# Patient Record
Sex: Female | Born: 1975 | Race: Black or African American | Hispanic: No | Marital: Single | Smoking: Never smoker
Health system: Southern US, Community
[De-identification: ages and names within clinical notes are randomized; demographics above are authoritative.]

## PROBLEM LIST (undated history)

## (undated) DIAGNOSIS — N920 Excessive and frequent menstruation with regular cycle: Secondary | ICD-10-CM

## (undated) DIAGNOSIS — N760 Acute vaginitis: Secondary | ICD-10-CM

## (undated) DIAGNOSIS — N898 Other specified noninflammatory disorders of vagina: Secondary | ICD-10-CM

## (undated) DIAGNOSIS — B9689 Other specified bacterial agents as the cause of diseases classified elsewhere: Secondary | ICD-10-CM

## (undated) DIAGNOSIS — E669 Obesity, unspecified: Secondary | ICD-10-CM

## (undated) DIAGNOSIS — Z8619 Personal history of other infectious and parasitic diseases: Secondary | ICD-10-CM

## (undated) DIAGNOSIS — D219 Benign neoplasm of connective and other soft tissue, unspecified: Secondary | ICD-10-CM

## (undated) DIAGNOSIS — D649 Anemia, unspecified: Secondary | ICD-10-CM

## (undated) DIAGNOSIS — L709 Acne, unspecified: Secondary | ICD-10-CM

## (undated) HISTORY — DX: Personal history of other infectious and parasitic diseases: Z86.19

## (undated) HISTORY — PX: NO PAST SURGERIES: SHX2092

## (undated) HISTORY — DX: Other specified noninflammatory disorders of vagina: N89.8

## (undated) HISTORY — DX: Benign neoplasm of connective and other soft tissue, unspecified: D21.9

## (undated) HISTORY — DX: Anemia, unspecified: D64.9

## (undated) HISTORY — DX: Excessive and frequent menstruation with regular cycle: N92.0

## (undated) HISTORY — DX: Obesity, unspecified: E66.9

## (undated) HISTORY — DX: Acute vaginitis: N76.0

## (undated) HISTORY — DX: Other specified bacterial agents as the cause of diseases classified elsewhere: B96.89

## (undated) HISTORY — DX: Acne, unspecified: L70.9

---

## 2001-08-02 ENCOUNTER — Other Ambulatory Visit: Admission: RE | Admit: 2001-08-02 | Discharge: 2001-08-02 | Payer: Self-pay | Admitting: Obstetrics and Gynecology

## 2002-07-25 ENCOUNTER — Other Ambulatory Visit: Admission: RE | Admit: 2002-07-25 | Discharge: 2002-07-25 | Payer: Self-pay | Admitting: Dermatology

## 2003-02-17 ENCOUNTER — Other Ambulatory Visit: Admission: RE | Admit: 2003-02-17 | Discharge: 2003-02-17 | Payer: Self-pay | Admitting: Dermatology

## 2007-12-28 ENCOUNTER — Other Ambulatory Visit: Admission: RE | Admit: 2007-12-28 | Discharge: 2007-12-28 | Payer: Self-pay | Admitting: Obstetrics & Gynecology

## 2008-11-05 ENCOUNTER — Ambulatory Visit (HOSPITAL_COMMUNITY): Admission: RE | Admit: 2008-11-05 | Discharge: 2008-11-05 | Payer: Self-pay | Admitting: Obstetrics and Gynecology

## 2010-07-22 ENCOUNTER — Other Ambulatory Visit (HOSPITAL_COMMUNITY)
Admission: RE | Admit: 2010-07-22 | Discharge: 2010-07-22 | Disposition: A | Discharge status: Home / Self Care | Payer: PRIVATE HEALTH INSURANCE | Source: Ambulatory Visit | Attending: Obstetrics and Gynecology | Admitting: Obstetrics and Gynecology

## 2010-07-22 DIAGNOSIS — Z01419 Encounter for gynecological examination (general) (routine) without abnormal findings: Secondary | ICD-10-CM | POA: Insufficient documentation

## 2010-07-22 DIAGNOSIS — Z113 Encounter for screening for infections with a predominantly sexual mode of transmission: Secondary | ICD-10-CM | POA: Insufficient documentation

## 2011-03-02 ENCOUNTER — Ambulatory Visit: Payer: PRIVATE HEALTH INSURANCE | Admitting: Family Medicine

## 2011-03-02 ENCOUNTER — Encounter: Payer: Self-pay | Admitting: Family Medicine

## 2011-07-04 DIAGNOSIS — Z1389 Encounter for screening for other disorder: Secondary | ICD-10-CM | POA: Diagnosis not present

## 2011-07-04 DIAGNOSIS — L708 Other acne: Secondary | ICD-10-CM | POA: Diagnosis not present

## 2011-08-02 ENCOUNTER — Other Ambulatory Visit (HOSPITAL_COMMUNITY)
Admission: RE | Admit: 2011-08-02 | Discharge: 2011-08-02 | Disposition: A | Discharge status: Home / Self Care | Payer: Medicare Other | Source: Ambulatory Visit | Attending: Obstetrics & Gynecology | Admitting: Obstetrics & Gynecology

## 2011-08-02 DIAGNOSIS — Z124 Encounter for screening for malignant neoplasm of cervix: Secondary | ICD-10-CM | POA: Diagnosis not present

## 2011-08-02 DIAGNOSIS — Z1389 Encounter for screening for other disorder: Secondary | ICD-10-CM | POA: Diagnosis not present

## 2012-03-15 DIAGNOSIS — N760 Acute vaginitis: Secondary | ICD-10-CM | POA: Diagnosis not present

## 2012-03-15 DIAGNOSIS — Z3202 Encounter for pregnancy test, result negative: Secondary | ICD-10-CM | POA: Diagnosis not present

## 2012-03-15 DIAGNOSIS — D259 Leiomyoma of uterus, unspecified: Secondary | ICD-10-CM | POA: Diagnosis not present

## 2012-03-15 DIAGNOSIS — L708 Other acne: Secondary | ICD-10-CM | POA: Diagnosis not present

## 2012-05-03 DIAGNOSIS — N760 Acute vaginitis: Secondary | ICD-10-CM | POA: Diagnosis not present

## 2012-07-20 DIAGNOSIS — H6120 Impacted cerumen, unspecified ear: Secondary | ICD-10-CM | POA: Diagnosis not present

## 2012-09-04 ENCOUNTER — Other Ambulatory Visit: Payer: Self-pay | Admitting: Obstetrics & Gynecology

## 2012-09-04 MED ORDER — NORGESTIM-ETH ESTRAD TRIPHASIC 0.18/0.215/0.25 MG-25 MCG PO TABS: 1.0000 | ORAL_TABLET | Freq: Every day | ORAL | Status: DC

## 2012-09-10 ENCOUNTER — Other Ambulatory Visit: Payer: Self-pay | Admitting: Adult Health

## 2012-10-24 ENCOUNTER — Encounter: Payer: Self-pay | Admitting: *Deleted

## 2012-10-25 ENCOUNTER — Ambulatory Visit: Payer: Self-pay | Admitting: Adult Health

## 2012-11-28 ENCOUNTER — Other Ambulatory Visit: Payer: Self-pay | Admitting: Obstetrics & Gynecology

## 2012-11-29 ENCOUNTER — Other Ambulatory Visit: Payer: Self-pay | Admitting: Obstetrics & Gynecology

## 2012-12-12 DIAGNOSIS — H6120 Impacted cerumen, unspecified ear: Secondary | ICD-10-CM | POA: Diagnosis not present

## 2012-12-12 DIAGNOSIS — H01119 Allergic dermatitis of unspecified eye, unspecified eyelid: Secondary | ICD-10-CM | POA: Diagnosis not present

## 2013-02-06 DIAGNOSIS — H53149 Visual discomfort, unspecified: Secondary | ICD-10-CM | POA: Diagnosis not present

## 2013-02-13 DIAGNOSIS — H53149 Visual discomfort, unspecified: Secondary | ICD-10-CM | POA: Diagnosis not present

## 2013-02-13 DIAGNOSIS — H01119 Allergic dermatitis of unspecified eye, unspecified eyelid: Secondary | ICD-10-CM | POA: Diagnosis not present

## 2013-03-06 ENCOUNTER — Other Ambulatory Visit: Payer: Self-pay | Admitting: Adult Health

## 2013-05-31 ENCOUNTER — Other Ambulatory Visit: Payer: Self-pay | Admitting: Adult Health

## 2013-06-24 DIAGNOSIS — J069 Acute upper respiratory infection, unspecified: Secondary | ICD-10-CM | POA: Diagnosis not present

## 2013-06-24 DIAGNOSIS — J392 Other diseases of pharynx: Secondary | ICD-10-CM | POA: Diagnosis not present

## 2013-08-09 ENCOUNTER — Other Ambulatory Visit: Payer: Self-pay | Admitting: Adult Health

## 2013-10-01 ENCOUNTER — Other Ambulatory Visit: Payer: Self-pay | Admitting: Adult Health

## 2013-10-02 ENCOUNTER — Other Ambulatory Visit: Payer: Self-pay | Admitting: Adult Health

## 2013-10-09 ENCOUNTER — Other Ambulatory Visit: Payer: Self-pay | Admitting: Adult Health

## 2013-10-25 ENCOUNTER — Other Ambulatory Visit: Payer: Self-pay | Admitting: Adult Health

## 2013-10-25 ENCOUNTER — Encounter: Payer: Self-pay | Admitting: *Deleted

## 2013-10-26 ENCOUNTER — Other Ambulatory Visit: Payer: Self-pay | Admitting: Obstetrics & Gynecology

## 2013-11-06 DIAGNOSIS — J392 Other diseases of pharynx: Secondary | ICD-10-CM | POA: Diagnosis not present

## 2013-11-06 DIAGNOSIS — H6120 Impacted cerumen, unspecified ear: Secondary | ICD-10-CM | POA: Diagnosis not present

## 2013-11-08 ENCOUNTER — Ambulatory Visit (INDEPENDENT_AMBULATORY_CARE_PROVIDER_SITE_OTHER): Payer: Medicare Other | Admitting: Adult Health

## 2013-11-08 ENCOUNTER — Other Ambulatory Visit (HOSPITAL_COMMUNITY)
Admission: RE | Admit: 2013-11-08 | Discharge: 2013-11-08 | Disposition: A | Discharge status: Home / Self Care | Payer: Medicare Other | Source: Ambulatory Visit | Attending: Adult Health | Admitting: Adult Health

## 2013-11-08 ENCOUNTER — Encounter: Payer: Self-pay | Admitting: Adult Health

## 2013-11-08 VITALS — BP 124/78 | HR 76 | Ht 64.0 in | Wt 220.0 lb

## 2013-11-08 DIAGNOSIS — Z0000 Encounter for general adult medical examination without abnormal findings: Secondary | ICD-10-CM

## 2013-11-08 DIAGNOSIS — Z113 Encounter for screening for infections with a predominantly sexual mode of transmission: Secondary | ICD-10-CM | POA: Insufficient documentation

## 2013-11-08 DIAGNOSIS — N920 Excessive and frequent menstruation with regular cycle: Secondary | ICD-10-CM

## 2013-11-08 DIAGNOSIS — Z1151 Encounter for screening for human papillomavirus (HPV): Secondary | ICD-10-CM | POA: Diagnosis not present

## 2013-11-08 DIAGNOSIS — D219 Benign neoplasm of connective and other soft tissue, unspecified: Secondary | ICD-10-CM | POA: Insufficient documentation

## 2013-11-08 DIAGNOSIS — Z124 Encounter for screening for malignant neoplasm of cervix: Secondary | ICD-10-CM | POA: Diagnosis not present

## 2013-11-08 DIAGNOSIS — N760 Acute vaginitis: Secondary | ICD-10-CM

## 2013-11-08 DIAGNOSIS — Z01419 Encounter for gynecological examination (general) (routine) without abnormal findings: Secondary | ICD-10-CM

## 2013-11-08 DIAGNOSIS — N898 Other specified noninflammatory disorders of vagina: Secondary | ICD-10-CM

## 2013-11-08 DIAGNOSIS — L709 Acne, unspecified: Secondary | ICD-10-CM | POA: Insufficient documentation

## 2013-11-08 DIAGNOSIS — Z309 Encounter for contraceptive management, unspecified: Secondary | ICD-10-CM | POA: Insufficient documentation

## 2013-11-08 DIAGNOSIS — B9689 Other specified bacterial agents as the cause of diseases classified elsewhere: Secondary | ICD-10-CM | POA: Insufficient documentation

## 2013-11-08 HISTORY — DX: Excessive and frequent menstruation with regular cycle: N92.0

## 2013-11-08 HISTORY — DX: Other specified noninflammatory disorders of vagina: N89.8

## 2013-11-08 LAB — POCT WET PREP (WET MOUNT)
Trichomonas Wet Prep HPF POC: NEGATIVE
WBC, Wet Prep HPF POC: POSITIVE

## 2013-11-08 MED ORDER — METRONIDAZOLE 500 MG PO TABS: ORAL_TABLET | ORAL | Status: DC

## 2013-11-08 MED ORDER — DAPSONE 5 % EX GEL: CUTANEOUS | Status: DC

## 2013-11-08 MED ORDER — NORGESTIMATE-ETH ESTRADIOL 0.25-35 MG-MCG PO TABS: 1.0000 | ORAL_TABLET | Freq: Every day | ORAL | Status: DC

## 2013-11-08 NOTE — Progress Notes (Signed)
Patient ID: Kimberly Black, female   DOB: 1975/10/31, 38 y.o.   MRN: [id number] History of Present Illness:  Kimberly Black is a 38 year old black female in for a pap and physical.She is complaining of acne and heavy periods and clots, as fibroids, ran out of OCs and using condoms.Has vaginal odor.  Current Medications, Allergies, Past Medical History, Past Surgical History, Family History and Social History were reviewed in [employer] record.     Review of Systems: Patient denies any headaches, blurred vision, shortness of breath,  abdominal pain, problems with bowel movements, urination, or intercourse. No joint pain or mood swings, she did say she sometimes has chest pain,discussed if weak,dizzy,or has nausea go to ER to checked out.    Physical Exam:BP 124/78  Pulse 76  Ht 5\' 4"  (1.626 m)  Wt 220 lb (99.791 kg)  BMI 37.74 kg/m2  LMP 10/22/2013 General:  Well developed, well nourished, no acute distress Skin:  Warm and dry Neck:  Midline trachea, normal thyroid Lungs; Clear to auscultation bilaterally Breast:  No dominant palpable mass, retraction, or nipple discharge Cardiovascular: Regular rate and rhythm Abdomen:  Soft, non tender, no hepatosplenomegaly Pelvic:  External genitalia is normal in appearance.  The vagina has good color, moisture and rugae, scant white discharge with odor. The cervix is bulbous, pap performed with HPV and GC/CHL.  Uterus is felt to be enlarged about 20 weeks size..  No   adnexal masses or tenderness noted.Wet prep:+WBCs and + clue cells Extremities:  No swelling or varicosities noted Psych:  No mood changes, alert and cooperative,seems happy Discussed changing OCs to see if helps periods and will get [place].  Impression: Yearly gyn exam Fibroids Menorrhagia Acne BV Vaginal odor    Plan: Return in 2 weeks for [place] and see me and fasting labs(CBC,CMP,TSH and lipids) Physical in 1 year Mammogram at 39 Rx flagyl 500 mg 1 bid x 7  days, with 1 refill, no alcohol, review handout on BV   Refilled actzone Rx sprintec disp 1 pack take 1 daily and start back when period starts with 38 refills Review handout on fibroids

## 2013-11-08 NOTE — Patient Instructions (Signed)
Acne Acne is a skin problem that causes pimples. Acne occurs when the pores in your skin get blocked. Your pores may become red, sore, and swollen (inflamed), or infected with a common skin bacterium (Propionibacterium acnes). Acne is a common skin problem. Up to 80% of people get acne at some time. Acne is especially common from the ages of 76 to 25. Acne usually goes away over time with proper treatment. CAUSES  Your pores each contain an oil gland. The oil glands make an oily substance called sebum. Acne happens when these glands get plugged with sebum, dead skin cells, and dirt. The P. acnes bacteria that are normally found in the oil glands then multiply, causing inflammation. Acne is commonly triggered by changes in your hormones. These hormonal changes can cause the oil glands to get bigger and to make more sebum. Factors that can make acne worse include:  Hormone changes during adolescence.  Hormone changes during women's menstrual cycles.  Hormone changes during pregnancy.  Oil-based cosmetics and hair products.  Harshly scrubbing the skin.  Strong soaps.  Stress.  Hormone problems due to certain diseases.  Long or oily hair rubbing against the skin.  Certain medicines.  Pressure from headbands, backpacks, or shoulder pads.  Exposure to certain oils and chemicals. SYMPTOMS  Acne often occurs on the face, neck, chest, and upper back. Symptoms include:  Small, red bumps (pimples or papules).  Whiteheads (closed comedones).  Blackheads (open comedones).  Small, pus-filled pimples (pustules).  Big, red pimples or pustules that feel tender. More severe acne can cause:  An infected area that contains a collection of pus (abscess).  Hard, painful, fluid-filled sacs (cysts).  Scars. DIAGNOSIS  Your caregiver can usually tell what the problem is by doing a physical exam. TREATMENT  There are many good treatments for acne. Some are available over-the-counter and some  are available with a prescription. The treatment that is best for you depends on the type of acne you have and how severe it is. It may take 2 months of treatment before your acne gets better. Common treatments include:  Creams and lotions that prevent oil glands from clogging.  Creams and lotions that treat or prevent infections and inflammation.  Antibiotics applied to the skin or taken as a pill.  Pills that decrease sebum production.  Birth control pills.  Light or laser treatments.  Minor surgery.  Injections of medicine into the affected areas.  Chemicals that cause peeling of the skin. HOME CARE INSTRUCTIONS  Good skin care is the most important part of treatment.  Wash your skin gently at least twice a day and after exercise. Always wash your skin before bed.  Use mild soap.  After each wash, apply a water-based skin moisturizer.  Keep your hair clean and off of your face. Shampoo your hair daily.  Only take medicines as directed by your caregiver.  Use a sunscreen or sunblock with SPF 30 or greater. This is especially important when you are using acne medicines.  Choose cosmetics that are noncomedogenic. This means they do not plug the oil glands.  Avoid leaning your chin or forehead on your hands.  Avoid wearing tight headbands or hats.  Avoid picking or squeezing your pimples. This can make your acne worse and cause scarring. SEEK MEDICAL CARE IF:   Your acne is not better after 8 weeks.  Your acne gets worse.  You have a large area of skin that is red or tender. Document Released: 05/13/2000 Document  Revised: 08/08/2011 Document Reviewed: 03/04/2011 [employer] Patient Information 2014 [place], [place]. Uterine Fibroid A uterine fibroid is a growth (tumor) that occurs in your uterus. This type of tumor is not cancerous and does not spread out of the uterus. You can have one or many fibroids. Fibroids can vary in size, weight, and where they grow in the  uterus. Some can become quite large. Most fibroids do not require medical treatment, but some can cause pain or heavy bleeding during and between periods. CAUSES  A fibroid is the result of a single uterine cell that keeps growing (unregulated), which is different than most cells in the human body. Most cells have a control mechanism that keeps them from reproducing without control.  SIGNS AND SYMPTOMS   Bleeding.  Pelvic pain and pressure.  Bladder problems due to the size of the fibroid.  Infertility and miscarriages depending on the size and location of the fibroid. DIAGNOSIS  Uterine fibroids are diagnosed through a physical exam. Your health care provider may feel the lumpy tumors during a pelvic exam. Ultrasonography may be done to get information regarding size, location, and number of tumors.  TREATMENT   Your health care provider may recommend watchful waiting. This involves getting the fibroid checked by your health care provider to see if it grows or shrinks.   Hormone treatment or an intrauterine device (IUD) may be prescribed.   Surgery may be needed to remove the fibroids (myomectomy) or the uterus (hysterectomy). This depends on your situation. When fibroids interfere with fertility and a woman wants to become pregnant, a health care provider may recommend having the fibroids removed.  [place] care depends on how you were treated. In general:   Keep all follow-up appointments with your health care provider.   Only take over-the-counter or prescription medicines as directed by your health care provider. If you were prescribed a hormone treatment, take the hormone medicines exactly as directed. Do not take aspirin. It can cause bleeding.   Talk to your health care provider about taking iron pills.  If your periods are troublesome but not so heavy, lie down with your feet raised slightly above your heart. Place cold packs on your lower abdomen.    If your periods are heavy, write down the number of pads or tampons you use per month. Bring this information to your health care provider.   Include green vegetables in your diet.  SEEK IMMEDIATE MEDICAL CARE IF:  You have pelvic pain or cramps not controlled with medicines.   You have a sudden increase in pelvic pain.   You have an increase in bleeding between and during periods.   You have excessive periods and soak tampons or pads in a half hour or less.  You feel lightheaded or have fainting episodes. Document Released: 05/13/2000 Document Revised: 03/06/2013 Document Reviewed: 12/13/2012 [facility] Patient Information 2014 [place], [place]. Return in 2 weeks for [place] and labs Physical in 1 year Mammogram at 39

## 2013-11-11 LAB — CYTOLOGY - PAP

## 2013-11-15 DIAGNOSIS — H6120 Impacted cerumen, unspecified ear: Secondary | ICD-10-CM | POA: Diagnosis not present

## 2013-11-15 DIAGNOSIS — J392 Other diseases of pharynx: Secondary | ICD-10-CM | POA: Diagnosis not present

## 2013-11-22 ENCOUNTER — Encounter: Payer: Self-pay | Admitting: Adult Health

## 2013-11-22 ENCOUNTER — Ambulatory Visit (INDEPENDENT_AMBULATORY_CARE_PROVIDER_SITE_OTHER): Payer: Medicare Other | Admitting: Adult Health

## 2013-11-22 ENCOUNTER — Other Ambulatory Visit: Payer: Medicare Other

## 2013-11-22 ENCOUNTER — Ambulatory Visit (INDEPENDENT_AMBULATORY_CARE_PROVIDER_SITE_OTHER): Payer: Medicare Other

## 2013-11-22 VITALS — BP 112/82 | Ht 64.0 in | Wt 216.0 lb

## 2013-11-22 DIAGNOSIS — N920 Excessive and frequent menstruation with regular cycle: Secondary | ICD-10-CM

## 2013-11-22 DIAGNOSIS — D259 Leiomyoma of uterus, unspecified: Secondary | ICD-10-CM

## 2013-11-22 DIAGNOSIS — D219 Benign neoplasm of connective and other soft tissue, unspecified: Secondary | ICD-10-CM

## 2013-11-22 DIAGNOSIS — E785 Hyperlipidemia, unspecified: Secondary | ICD-10-CM | POA: Diagnosis not present

## 2013-11-22 DIAGNOSIS — N921 Excessive and frequent menstruation with irregular cycle: Secondary | ICD-10-CM

## 2013-11-22 LAB — CBC
HCT: 33.9 % — ABNORMAL LOW (ref 36.0–46.0)
Hemoglobin: 10.9 g/dL — ABNORMAL LOW (ref 12.0–15.0)
MCH: 22.7 pg — ABNORMAL LOW (ref 26.0–34.0)
MCHC: 32.2 g/dL (ref 30.0–36.0)
MCV: 70.6 fL — ABNORMAL LOW (ref 78.0–100.0)
Platelets: 384 K/uL (ref 150–400)
RBC: 4.8 MIL/uL (ref 3.87–5.11)
RDW: 17.1 % — ABNORMAL HIGH (ref 11.5–15.5)
WBC: 8.2 K/uL (ref 4.0–10.5)

## 2013-11-22 LAB — LIPID PANEL
CHOL/HDL RATIO: 3.6 ratio
Cholesterol: 185 mg/dL (ref 0–200)
HDL: 52 mg/dL (ref >39)
LDL CALC: 119 mg/dL — AB (ref 0–99)
TRIGLYCERIDES: 72 mg/dL (ref <150)
VLDL: 14 mg/dL (ref 0–40)

## 2013-11-22 LAB — COMPREHENSIVE METABOLIC PANEL
ALK PHOS: 57 U/L (ref 39–117)
ALT: 17 U/L (ref 0–35)
AST: 14 U/L (ref 0–37)
Albumin: 4.2 g/dL (ref 3.5–5.2)
BILIRUBIN TOTAL: 0.5 mg/dL (ref 0.2–1.2)
BUN: 10 mg/dL (ref 6–23)
CO2: 26 mEq/L (ref 19–32)
CREATININE: 0.59 mg/dL (ref 0.50–1.10)
Calcium: 9.2 mg/dL (ref 8.4–10.5)
Chloride: 103 mEq/L (ref 96–112)
Glucose, Bld: 87 mg/dL (ref 70–99)
Potassium: 4.4 mEq/L (ref 3.5–5.3)
Sodium: 138 mEq/L (ref 135–145)
Total Protein: 7.1 g/dL (ref 6.0–8.3)

## 2013-11-22 NOTE — Progress Notes (Signed)
Subjective:     Patient ID: Kimberly Black, female   DOB: April 01, 1976, 38 y.o.   MRN: [id number]  HPI [name] is a 38 year old black female in for [place] to assess for fibroids, has menorrhagia. Has some back pain today after [place]. Took meds for BV.To get fasting labs today.  Review of Systems See HPI Reviewed past medical,surgical, social and family history. Reviewed medications and allergies.     Objective:   Physical Exam BP 112/82  Ht 5\' 4"  (1.626 m)  Wt 216 lb (97.977 kg)  BMI 37.06 kg/m2  LMP 10/22/2013   Reviewed [place] with pt. Uterus 8.2 x 8.6 x 6.1 cm, with multiple fibroids noted with 1 exophytic fibroids noted off Fundus (extends to near epigastric area = 13.5 x 11.8 x 10.4cm) other fibroids within myometrium largest=4.5cm  Endometrium 10.7 mm, distorted by fibroids  Right ovary 2.8 x 1.6 x 1.5 cm,  Left ovary 5.2 x 3.3 x 3.1 cm, with 3.0 x 2.2cm simple cyst  No free fluid noted  Technician Comments:  Anteverted uterus with multiple fibroids, large exophytic fibroid noted, Endom-10.62mm distorted by fibroids, Rt ovary appears WNL, Lt ovary with 3.0 x 2.2cm simple cyst, no free fluid noted  Discussed with Dr Elonda Husky, will make appt to discuss surgical options.  Assessment:    Uterine fibroids Menorrhagia     Plan:     Return in 2 weeks to talk with Dr Elonda Husky about surgical options Review handout on fibroids and menorrhagia Check CBC,CMP,TSH and lipids

## 2013-11-22 NOTE — Patient Instructions (Addendum)
Menorrhagia Menorrhagia is the medical term for when your menstrual periods are heavy or last longer than usual. With menorrhagia, every period you have may cause enough blood loss and cramping that you are unable to maintain your usual activities. CAUSES  In some cases, the cause of heavy periods is unknown, but a number of conditions may cause menorrhagia. Common causes include:  A problem with the hormone-producing thyroid gland (hypothyroid).  Noncancerous growths in the uterus (polyps or fibroids).  An imbalance of the estrogen and progesterone hormones.  One of your ovaries not releasing an egg during one or more months.  Side effects of having an intrauterine device (IUD).  Side effects of some medicines, such as anti-inflammatory medicines or blood thinners.  A bleeding disorder that stops your blood from clotting normally. SIGNS AND SYMPTOMS  During a normal period, bleeding lasts between 4 and 8 days. Signs that your periods are too heavy include:  You routinely have to change your pad or tampon every 1 or 2 hours because it is completely soaked.  You pass blood clots larger than 1 inch (2.5 cm) in size.  You have bleeding for more than 7 days.  You need to use pads and tampons at the same time because of heavy bleeding.  You need to wake up to change your pads or tampons during the night.  You have symptoms of anemia, such as tiredness, fatigue, or shortness of breath. DIAGNOSIS  Your health care provider will perform a physical exam and ask you questions about your symptoms and menstrual history. Other tests may be ordered based on what the health care provider finds during the exam. These tests can include:  Blood tests. Blood tests are used to check if you are pregnant or have hormonal changes, a bleeding or thyroid disorder, low iron levels (anemia), or other problems.  Endometrial biopsy. Your health care provider takes a sample of tissue from the inside of your  uterus to be examined under a microscope.  Pelvic ultrasound. This test uses sound waves to make a picture of your uterus, ovaries, and vagina. The pictures can show if you have fibroids or other growths.  Hysteroscopy. For this test, your health care provider will use a small telescope to look inside your uterus. Based on the results of your initial tests, your health care provider may recommend further testing. TREATMENT  Treatment may not be needed. If it is needed, your health care provider may recommend treatment with one or more medicines first. If these do not reduce bleeding enough, a surgical treatment might be an option. The best treatment for you will depend on:   Whether you need to prevent pregnancy.  Your desire to have children in the future.  The cause and severity of your bleeding.  Your opinion and personal preference.  Medicines for menorrhagia may include:  Birth control methods that use hormones. These include the pill, skin patch, vaginal ring, shots that you get every 3 months, hormonal IUD, and implant. These treatments reduce bleeding during your menstrual period.  Medicines that thicken blood and slow bleeding.  Medicines that reduce swelling, such as ibuprofen.  Medicines that contain a synthetic hormone called progestin.   Medicines that make the ovaries stop working for a short time.  You may need surgical treatment for menorrhagia if the medicines are unsuccessful. Treatment options include:  Dilation and curettage (D&C). In this procedure, your health care provider opens (dilates) your cervix and then scrapes or suctions tissue from   the lining of your uterus to reduce menstrual bleeding.  Operative hysteroscopy. This procedure uses a tiny tube with a light (hysteroscope) to view your uterine cavity and can help in the surgical removal of a polyp that may be causing heavy periods.  Endometrial ablation. Through various techniques, your health care  provider permanently destroys the entire lining of your uterus (endometrium). After endometrial ablation, most women have little or no menstrual flow. Endometrial ablation reduces your ability to become pregnant.  Endometrial resection. This surgical procedure uses an electrosurgical wire loop to remove the lining of the uterus. This procedure also reduces your ability to become pregnant.  Hysterectomy. Surgical removal of the uterus and cervix is a permanent procedure that stops menstrual periods. Pregnancy is not possible after a hysterectomy. This procedure requires anesthesia and hospitalization. HOME CARE INSTRUCTIONS   Only take over-the-counter or prescription medicines as directed by your health care provider. Take prescribed medicines exactly as directed. Do not change or switch medicines without consulting your health care provider.  Take any prescribed iron pills exactly as directed by your health care provider. Long-term heavy bleeding may result in low iron levels. Iron pills help replace the iron your body lost from heavy bleeding. Iron may cause constipation. If this becomes a problem, increase the bran, fruits, and roughage in your diet.  Do not take aspirin or medicines that contain aspirin 1 week before or during your menstrual period. Aspirin may make the bleeding worse.  If you need to change your sanitary pad or tampon more than once every 2 hours, stay in bed and rest as much as possible until the bleeding stops.  Eat well-balanced meals. Eat foods high in iron. Examples are leafy green vegetables, meat, liver, eggs, and whole grain breads and cereals. Do not try to lose weight until the abnormal bleeding has stopped and your blood iron level is back to normal. SEEK MEDICAL CARE IF:   You soak through a pad or tampon every 1 or 2 hours, and this happens every time you have a period.  You need to use pads and tampons at the same time because you are bleeding so much.  You  need to change your pad or tampon during the night.  You have a period that lasts for more than 8 days.  You pass clots bigger than 1 inch wide.  You have irregular periods that happen more or less often than once a month.  You feel dizzy or faint.  You feel very weak or tired.  You feel short of breath or feel your heart is beating too fast when you exercise.  You have nausea and vomiting or diarrhea while you are taking your medicine.  You have any problems that may be related to the medicine you are taking. SEEK IMMEDIATE MEDICAL CARE IF:   You soak through 4 or more pads or tampons in 2 hours.  You have any bleeding while you are pregnant. MAKE SURE YOU:   Understand these instructions.  Will watch your condition.  Will get help right away if you are not doing well or get worse. Document Released: 05/16/2005 Document Revised: 05/21/2013 Document Reviewed: 11/04/2012 [facility] Patient Information 2015 [place], [place]. This information is not intended to replace advice given to you by your health care provider. Make sure you discuss any questions you have with your health care provider. Uterine Fibroid A uterine fibroid is a growth (tumor) that occurs in your uterus. This type of tumor is not cancerous  and does not spread out of the uterus. You can have one or many fibroids. Fibroids can vary in size, weight, and where they grow in the uterus. Some can become quite large. Most fibroids do not require medical treatment, but some can cause pain or heavy bleeding during and between periods. CAUSES  A fibroid is the result of a single uterine cell that keeps growing (unregulated), which is different than most cells in the human body. Most cells have a control mechanism that keeps them from reproducing without control.  SIGNS AND SYMPTOMS   Bleeding.  Pelvic pain and pressure.  Bladder problems due to the size of the fibroid.  Infertility and miscarriages depending on the size  and location of the fibroid. DIAGNOSIS  Uterine fibroids are diagnosed through a physical exam. Your health care provider may feel the lumpy tumors during a pelvic exam. Ultrasonography may be done to get information regarding size, location, and number of tumors.  TREATMENT   Your health care provider may recommend watchful waiting. This involves getting the fibroid checked by your health care provider to see if it grows or shrinks.   Hormone treatment or an intrauterine device (IUD) may be prescribed.   Surgery may be needed to remove the fibroids (myomectomy) or the uterus (hysterectomy). This depends on your situation. When fibroids interfere with fertility and a woman wants to become pregnant, a health care provider may recommend having the fibroids removed.  [place] care depends on how you were treated. In general:   Keep all follow-up appointments with your health care provider.   Only take over-the-counter or prescription medicines as directed by your health care provider. If you were prescribed a hormone treatment, take the hormone medicines exactly as directed. Do not take aspirin. It can cause bleeding.   Talk to your health care provider about taking iron pills.  If your periods are troublesome but not so heavy, lie down with your feet raised slightly above your heart. Place cold packs on your lower abdomen.   If your periods are heavy, write down the number of pads or tampons you use per month. Bring this information to your health care provider.   Include green vegetables in your diet.  SEEK IMMEDIATE MEDICAL CARE IF:  You have pelvic pain or cramps not controlled with medicines.   You have a sudden increase in pelvic pain.   You have an increase in bleeding between and during periods.   You have excessive periods and soak tampons or pads in a half hour or less.  You feel lightheaded or have fainting episodes. Document Released:  05/13/2000 Document Revised: 03/06/2013 Document Reviewed: 12/13/2012 [facility] Patient Information 2015 [place], [place]. This information is not intended to replace advice given to you by your health care provider. Make sure you discuss any questions you have with your health care provider. Return in 2 weeks to talk with Dr Elonda Husky about options

## 2013-11-23 LAB — TSH: TSH: 0.829 u[IU]/mL (ref 0.350–4.500)

## 2013-11-25 ENCOUNTER — Telehealth: Payer: Self-pay | Admitting: Adult Health

## 2013-11-25 ENCOUNTER — Encounter: Payer: Self-pay | Admitting: Adult Health

## 2013-11-25 DIAGNOSIS — D649 Anemia, unspecified: Secondary | ICD-10-CM

## 2013-11-25 HISTORY — DX: Anemia, unspecified: D64.9

## 2013-11-25 NOTE — Telephone Encounter (Signed)
Pt aware of labs and need to take MV with Iron keep appt

## 2013-11-25 NOTE — Telephone Encounter (Signed)
**Note De-identified Kimberly Black Obfuscation** Left message with her Mom to call me 

## 2013-12-06 ENCOUNTER — Ambulatory Visit: Payer: Medicare Other | Admitting: Obstetrics & Gynecology

## 2013-12-10 ENCOUNTER — Ambulatory Visit: Payer: Medicare Other | Admitting: Obstetrics & Gynecology

## 2013-12-12 ENCOUNTER — Ambulatory Visit: Payer: Medicare Other | Admitting: Obstetrics & Gynecology

## 2013-12-12 ENCOUNTER — Encounter: Payer: Self-pay | Admitting: *Deleted

## 2014-03-20 ENCOUNTER — Other Ambulatory Visit: Payer: Self-pay | Admitting: Adult Health

## 2014-03-31 ENCOUNTER — Encounter: Payer: Self-pay | Admitting: Adult Health

## 2014-05-01 ENCOUNTER — Telehealth: Payer: Self-pay | Admitting: Adult Health

## 2014-05-01 NOTE — Telephone Encounter (Signed)
Spoke with pt letting her know we received prior auth form and fillled it out and faxed it over. We are waiting to see if they will cover med or not. [place]

## 2014-05-01 NOTE — Telephone Encounter (Signed)
Pt is requesting Aczone gel and was told it needed prior auth. Advised pt to call pharmacy and have them send over prior auth form. Pt voiced understanding. Kimberly Black

## 2014-05-02 ENCOUNTER — Telehealth: Payer: Self-pay | Admitting: *Deleted

## 2014-05-02 NOTE — Telephone Encounter (Signed)
Antonette with [facility] Medicare called stating Aczone gel was approved from between 05/01/14-05/02/15. Pt has been made aware. [place]

## 2014-08-06 ENCOUNTER — Other Ambulatory Visit: Payer: Self-pay | Admitting: Adult Health

## 2014-08-15 DIAGNOSIS — M1990 Unspecified osteoarthritis, unspecified site: Secondary | ICD-10-CM | POA: Diagnosis not present

## 2014-08-15 DIAGNOSIS — M79609 Pain in unspecified limb: Secondary | ICD-10-CM | POA: Diagnosis not present

## 2014-08-15 DIAGNOSIS — M791 Myalgia: Secondary | ICD-10-CM | POA: Diagnosis not present

## 2014-08-15 DIAGNOSIS — M25562 Pain in left knee: Secondary | ICD-10-CM | POA: Diagnosis not present

## 2014-09-30 ENCOUNTER — Emergency Department (HOSPITAL_COMMUNITY): Payer: Medicare Other

## 2014-09-30 ENCOUNTER — Encounter (HOSPITAL_COMMUNITY): Payer: Self-pay

## 2014-09-30 ENCOUNTER — Emergency Department (HOSPITAL_COMMUNITY)
Admission: EM | Admit: 2014-09-30 | Discharge: 2014-09-30 | Disposition: A | Discharge status: Home / Self Care | Payer: Medicare Other | Attending: Emergency Medicine | Admitting: Emergency Medicine

## 2014-09-30 DIAGNOSIS — S8002XA Contusion of left knee, initial encounter: Secondary | ICD-10-CM | POA: Insufficient documentation

## 2014-09-30 DIAGNOSIS — Z791 Long term (current) use of non-steroidal anti-inflammatories (NSAID): Secondary | ICD-10-CM | POA: Insufficient documentation

## 2014-09-30 DIAGNOSIS — M545 Low back pain, unspecified: Secondary | ICD-10-CM

## 2014-09-30 DIAGNOSIS — Y998 Other external cause status: Secondary | ICD-10-CM | POA: Insufficient documentation

## 2014-09-30 DIAGNOSIS — M79605 Pain in left leg: Secondary | ICD-10-CM | POA: Diagnosis not present

## 2014-09-30 DIAGNOSIS — M5416 Radiculopathy, lumbar region: Secondary | ICD-10-CM | POA: Diagnosis not present

## 2014-09-30 DIAGNOSIS — Y9389 Activity, other specified: Secondary | ICD-10-CM | POA: Diagnosis not present

## 2014-09-30 DIAGNOSIS — R52 Pain, unspecified: Secondary | ICD-10-CM | POA: Diagnosis not present

## 2014-09-30 DIAGNOSIS — S3992XA Unspecified injury of lower back, initial encounter: Secondary | ICD-10-CM | POA: Diagnosis not present

## 2014-09-30 DIAGNOSIS — Y92410 Unspecified street and highway as the place of occurrence of the external cause: Secondary | ICD-10-CM | POA: Insufficient documentation

## 2014-09-30 DIAGNOSIS — Z79899 Other long term (current) drug therapy: Secondary | ICD-10-CM | POA: Insufficient documentation

## 2014-09-30 DIAGNOSIS — S8992XA Unspecified injury of left lower leg, initial encounter: Secondary | ICD-10-CM | POA: Diagnosis present

## 2014-09-30 MED ORDER — OXYCODONE-ACETAMINOPHEN 5-325 MG PO TABS
1.0000 | ORAL_TABLET | Freq: Once | ORAL | Status: AC
  Administered 2014-09-30: 1 via ORAL
  Filled 2014-09-30: qty 1

## 2014-09-30 MED ORDER — CYCLOBENZAPRINE HCL 10 MG PO TABS: 10.0000 mg | ORAL_TABLET | Freq: Two times a day (BID) | ORAL | Status: DC | PRN

## 2014-09-30 MED ORDER — NAPROXEN 500 MG PO TABS: 500.0000 mg | ORAL_TABLET | Freq: Two times a day (BID) | ORAL | Status: DC

## 2014-09-30 MED ORDER — HYDROCODONE-ACETAMINOPHEN 5-325 MG PO TABS: 1.0000 | ORAL_TABLET | ORAL | Status: DC | PRN
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
## 2014-09-30 NOTE — ED Notes (Signed)
Pt verbalized understanding of no driving and to use caution within 4 hours of taking pain meds due to meds cause drowsiness 
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
## 2014-11-29 ENCOUNTER — Other Ambulatory Visit: Payer: Self-pay | Admitting: Adult Health

## 2014-12-04 ENCOUNTER — Other Ambulatory Visit: Payer: Self-pay | Admitting: Adult Health

## 2014-12-08 ENCOUNTER — Other Ambulatory Visit: Payer: Self-pay | Admitting: Adult Health

## 2014-12-12 ENCOUNTER — Other Ambulatory Visit: Payer: Self-pay | Admitting: Adult Health

## 2014-12-23 ENCOUNTER — Ambulatory Visit: Payer: Medicare Other | Admitting: Adult Health

## 2014-12-31 ENCOUNTER — Ambulatory Visit: Payer: Medicare Other | Admitting: Adult Health

## 2015-01-07 ENCOUNTER — Ambulatory Visit (INDEPENDENT_AMBULATORY_CARE_PROVIDER_SITE_OTHER): Payer: Medicare Other | Admitting: Adult Health

## 2015-01-07 ENCOUNTER — Encounter: Payer: Self-pay | Admitting: Adult Health

## 2015-01-07 VITALS — BP 128/70 | HR 80 | Ht 63.0 in | Wt 207.5 lb

## 2015-01-07 DIAGNOSIS — L709 Acne, unspecified: Secondary | ICD-10-CM | POA: Diagnosis not present

## 2015-01-07 DIAGNOSIS — A499 Bacterial infection, unspecified: Secondary | ICD-10-CM | POA: Diagnosis not present

## 2015-01-07 DIAGNOSIS — B9689 Other specified bacterial agents as the cause of diseases classified elsewhere: Secondary | ICD-10-CM

## 2015-01-07 DIAGNOSIS — N9489 Other specified conditions associated with female genital organs and menstrual cycle: Secondary | ICD-10-CM

## 2015-01-07 DIAGNOSIS — N760 Acute vaginitis: Secondary | ICD-10-CM

## 2015-01-07 DIAGNOSIS — N898 Other specified noninflammatory disorders of vagina: Secondary | ICD-10-CM | POA: Diagnosis not present

## 2015-01-07 DIAGNOSIS — D259 Leiomyoma of uterus, unspecified: Secondary | ICD-10-CM

## 2015-01-07 HISTORY — DX: Other specified noninflammatory disorders of vagina: N89.8

## 2015-01-07 MED ORDER — METRONIDAZOLE 500 MG PO TABS
500.0000 mg | ORAL_TABLET | Freq: Two times a day (BID) | ORAL | Status: DC
Start: 2015-01-07 — End: 2015-03-17

## 2015-01-07 MED ORDER — DOXYCYCLINE HYCLATE 100 MG PO CAPS: ORAL_CAPSULE | ORAL | Status: DC

## 2015-01-07 MED ORDER — DAPSONE 5 % EX GEL: CUTANEOUS | Status: DC

## 2015-01-07 NOTE — Progress Notes (Signed)
Subjective:     Patient ID: Kimberly Black, female   DOB: 27-Jul-1975, 39 y.o.   MRN: [id number]  HPI Kimberly Black is a 39 year old black female in complaining of vaginal discharge with odor, and she requests refills on acne meds.  Review of Systems Patient denies any headaches, hearing loss, fatigue, blurred vision, shortness of breath, chest pain, abdominal pain, problems with bowel movements, urination, or intercourse. No joint pain or mood swings, has clots with periods and back and legs ache at times. .See HPI for positives.  Reviewed past medical,surgical, social and family history. Reviewed medications and allergies.     Objective:   Physical Exam BP 128/70 mmHg  Pulse 80  Ht 5\' 3"  (1.6 m)  Wt 207 lb 8 oz (94.121 kg)  BMI 36.77 kg/m2  LMP 12/24/2014 Skin warm and dry.Pelvic: external genitalia is normal in appearance no lesions, vagina: white discharge with odor,urethra has no lesions or masses noted, cervix:smooth and bulbous, uterus: enlarged about 21-22 week size, with fibroid at fundus, no masses felt, adnexa: no masses or tenderness noted. Bladder is non tender and no masses felt. Wet prep: + for clue cells and +WBCs.   She has known fibroids and is on OCs, can't have surgery just yet is caring for mom.  Assessment:     Vaginal discharge Vaginal odor BV Fibroids Acne     Plan:    Continue OCs Rx flagyl 500 mg 1 bid x 7 days, no alcohol, review handout on BV   Refilled dapsone x 3  Refilled doxycycline x 3 Return in 4 weeks for physical

## 2015-01-07 NOTE — Patient Instructions (Signed)
Bacterial Vaginosis Bacterial vaginosis is a vaginal infection that occurs when the normal balance of bacteria in the vagina is disrupted. It results from an overgrowth of certain bacteria. This is the most common vaginal infection in women of childbearing age. Treatment is important to prevent complications, especially in pregnant women, as it can cause a premature delivery. CAUSES  Bacterial vaginosis is caused by an increase in harmful bacteria that are normally present in smaller amounts in the vagina. Several different kinds of bacteria can cause bacterial vaginosis. However, the reason that the condition develops is not fully understood. RISK FACTORS Certain activities or behaviors can put you at an increased risk of developing bacterial vaginosis, including:  Having a new sex partner or multiple sex partners.  Douching.  Using an intrauterine device (IUD) for contraception. Women do not get bacterial vaginosis from toilet seats, bedding, swimming pools, or contact with objects around them. SIGNS AND SYMPTOMS  Some women with bacterial vaginosis have no signs or symptoms. Common symptoms include:  Grey vaginal discharge.  A fishlike odor with discharge, especially after sexual intercourse.  Itching or burning of the vagina and vulva.  Burning or pain with urination. DIAGNOSIS  Your health care provider will take a medical history and examine the vagina for signs of bacterial vaginosis. A sample of vaginal fluid may be taken. Your health care provider will look at this sample under a microscope to check for bacteria and abnormal cells. A vaginal pH test may also be done.  TREATMENT  Bacterial vaginosis may be treated with antibiotic medicines. These may be given in the form of a pill or a vaginal cream. A second round of antibiotics may be prescribed if the condition comes back after treatment.  HOME CARE INSTRUCTIONS   Only take over-the-counter or prescription medicines as  directed by your health care provider.  If antibiotic medicine was prescribed, take it as directed. Make sure you finish it even if you start to feel better.  Do not have sex until treatment is completed.  Tell all sexual partners that you have a vaginal infection. They should see their health care provider and be treated if they have problems, such as a mild rash or itching.  Practice safe sex by using condoms and only having one sex partner. SEEK MEDICAL CARE IF:   Your symptoms are not improving after 3 days of treatment.  You have increased discharge or pain.  You have a fever. MAKE SURE YOU:   Understand these instructions.  Will watch your condition.  Will get help right away if you are not doing well or get worse. FOR MORE INFORMATION  Centers for Disease Control and Prevention, Division of STD Prevention: AppraiserFraud.fi American Sexual Health Association (ASHA): www.ashastd.org  Document Released: 05/16/2005 Document Revised: 03/06/2013 Document Reviewed: 12/26/2012 [facility] Patient Information 2015 [place], [place]. This information is not intended to replace advice given to you by your health care provider. Make sure you discuss any questions you have with your health care provider. Return in 4 weeks for physical

## 2015-02-05 ENCOUNTER — Other Ambulatory Visit: Payer: Medicare Other | Admitting: Adult Health

## 2015-02-16 ENCOUNTER — Encounter: Payer: Self-pay | Admitting: Adult Health

## 2015-02-16 ENCOUNTER — Other Ambulatory Visit: Payer: Medicare Other | Admitting: Adult Health

## 2015-03-17 ENCOUNTER — Other Ambulatory Visit: Payer: Self-pay | Admitting: Adult Health

## 2015-05-07 ENCOUNTER — Other Ambulatory Visit: Payer: Self-pay | Admitting: Adult Health

## 2015-05-08 ENCOUNTER — Other Ambulatory Visit: Payer: Self-pay | Admitting: Adult Health

## 2015-06-29 ENCOUNTER — Telehealth: Payer: Self-pay | Admitting: Advanced Practice Midwife

## 2015-06-29 NOTE — Telephone Encounter (Signed)
Pt requesting a refill on Retina 0.025%. Pt states Nigel Berthold, CNM filled in the past.   Pt informed Manus Gunning not in the office until Wednesday, verbalized understanding.

## 2015-07-01 MED ORDER — TRETINOIN 0.025 % EX CREA: TOPICAL_CREAM | Freq: Every day | CUTANEOUS | Status: DC

## 2015-07-01 NOTE — Telephone Encounter (Signed)
Retin a 0.25% 45g w/3 RF sent to p harmacy

## 2015-07-07 ENCOUNTER — Ambulatory Visit (INDEPENDENT_AMBULATORY_CARE_PROVIDER_SITE_OTHER): Payer: Medicare Other | Admitting: Adult Health

## 2015-07-07 ENCOUNTER — Encounter: Payer: Self-pay | Admitting: Adult Health

## 2015-07-07 VITALS — BP 122/70 | HR 72 | Ht 63.0 in | Wt 211.0 lb

## 2015-07-07 DIAGNOSIS — N898 Other specified noninflammatory disorders of vagina: Secondary | ICD-10-CM | POA: Diagnosis not present

## 2015-07-07 DIAGNOSIS — B9689 Other specified bacterial agents as the cause of diseases classified elsewhere: Secondary | ICD-10-CM

## 2015-07-07 DIAGNOSIS — N9489 Other specified conditions associated with female genital organs and menstrual cycle: Secondary | ICD-10-CM | POA: Diagnosis not present

## 2015-07-07 DIAGNOSIS — A499 Bacterial infection, unspecified: Secondary | ICD-10-CM

## 2015-07-07 DIAGNOSIS — N760 Acute vaginitis: Secondary | ICD-10-CM

## 2015-07-07 LAB — POCT WET PREP (WET MOUNT)
Clue Cells Wet Prep Whiff POC: POSITIVE
WBC, Wet Prep HPF POC: POSITIVE

## 2015-07-07 MED ORDER — METRONIDAZOLE 500 MG PO TABS: 500.0000 mg | ORAL_TABLET | Freq: Two times a day (BID) | ORAL | Status: DC

## 2015-07-07 NOTE — Progress Notes (Signed)
Subjective:     Patient ID: Kimberly Black, female   DOB: 03-Mar-1976, 40 y.o.   MRN: [id number]  HPI Kimberly Black is a 40 year old black female in complaining of vaginal discharge with odor and some itching and burning at times, no new products or partners.  Review of Systems Patient denies any headaches, hearing loss, fatigue, blurred vision, shortness of breath, chest pain, abdominal pain, problems with bowel movements, urination, or intercourse. No joint pain or mood swings.See HPI for positives. Reviewed past medical,surgical, social and family history. Reviewed medications and allergies.     Objective:   Physical Exam BP 122/70 mmHg  Pulse 72  Ht 5\' 3"  (1.6 m)  Wt 211 lb (95.709 kg)  BMI 37.39 kg/m2  LMP 06/14/2015 Skin warm and dry.Pelvic: external genitalia is normal in appearance no lesions, vagina: white discharge with odor,urethra has no lesions or masses noted, cervix:smooth and bulbous, uterus: normal size, shape and contour, non tender, no masses felt, adnexa: no masses or tenderness noted. Bladder is non tender and no masses felt. Abdomen is soft and non tender, no HSM noted, pap due next year. Wet prep: + for clue cells and +WBCs.Discussed not wearing thongs, taking tub baths and not douching.Face time 15 minutes, with 50 % counseling. She declines STD testing.     Assessment:     Vaginal discharge  Vaginal odor BV    Plan:     Rx flagyl 500 mg 1 bid x 7 days,with 1 refill, no alcohol, review handout on BV   Follow up prn

## 2015-07-07 NOTE — Patient Instructions (Signed)
Bacterial Vaginosis Bacterial vaginosis is a vaginal infection that occurs when the normal balance of bacteria in the vagina is disrupted. It results from an overgrowth of certain bacteria. This is the most common vaginal infection in women of childbearing age. Treatment is important to prevent complications, especially in pregnant women, as it can cause a premature delivery. CAUSES  Bacterial vaginosis is caused by an increase in harmful bacteria that are normally present in smaller amounts in the vagina. Several different kinds of bacteria can cause bacterial vaginosis. However, the reason that the condition develops is not fully understood. RISK FACTORS Certain activities or behaviors can put you at an increased risk of developing bacterial vaginosis, including:  Having a new sex partner or multiple sex partners.  Douching.  Using an intrauterine device (IUD) for contraception. Women do not get bacterial vaginosis from toilet seats, bedding, swimming pools, or contact with objects around them. SIGNS AND SYMPTOMS  Some women with bacterial vaginosis have no signs or symptoms. Common symptoms include:  Grey vaginal discharge.  A fishlike odor with discharge, especially after sexual intercourse.  Itching or burning of the vagina and vulva.  Burning or pain with urination. DIAGNOSIS  Your health care provider will take a medical history and examine the vagina for signs of bacterial vaginosis. A sample of vaginal fluid may be taken. Your health care provider will look at this sample under a microscope to check for bacteria and abnormal cells. A vaginal pH test may also be done.  TREATMENT  Bacterial vaginosis may be treated with antibiotic medicines. These may be given in the form of a pill or a vaginal cream. A second round of antibiotics may be prescribed if the condition comes back after treatment. Because bacterial vaginosis increases your risk for sexually transmitted diseases, getting  treated can help reduce your risk for chlamydia, gonorrhea, HIV, and herpes. HOME CARE INSTRUCTIONS   Only take over-the-counter or prescription medicines as directed by your health care provider.  If antibiotic medicine was prescribed, take it as directed. Make sure you finish it even if you start to feel better.  Tell all sexual partners that you have a vaginal infection. They should see their health care provider and be treated if they have problems, such as a mild rash or itching.  During treatment, it is important that you follow these instructions:  Avoid sexual activity or use condoms correctly.  Do not douche.  Avoid alcohol as directed by your health care provider.  Avoid breastfeeding as directed by your health care provider. SEEK MEDICAL CARE IF:   Your symptoms are not improving after 3 days of treatment.  You have increased discharge or pain.  You have a fever. MAKE SURE YOU:   Understand these instructions.  Will watch your condition.  Will get help right away if you are not doing well or get worse. FOR MORE INFORMATION  Centers for Disease Control and Prevention, Division of STD Prevention: AppraiserFraud.fi American Sexual Health Association (ASHA): www.ashastd.org    This information is not intended to replace advice given to you by your health care provider. Make sure you discuss any questions you have with your health care provider.   Document Released: 05/16/2005 Document Revised: 06/06/2014 Document Reviewed: 12/26/2012 Elsevier Interactive Patient Education 2016 [place]. No alcohol with flagyl

## 2015-07-08 ENCOUNTER — Telehealth: Payer: Self-pay | Admitting: Advanced Practice Midwife

## 2015-07-08 NOTE — Telephone Encounter (Signed)
It is not covered/approved. Will have to pay out of pocket if wanted.

## 2015-07-09 ENCOUNTER — Telehealth: Payer: Self-pay | Admitting: *Deleted

## 2015-07-09 ENCOUNTER — Other Ambulatory Visit: Payer: Self-pay | Admitting: Advanced Practice Midwife

## 2015-07-09 MED ORDER — TAZAROTENE 0.05 % EX CREA: TOPICAL_CREAM | Freq: Every day | CUTANEOUS | Status: DC

## 2015-07-09 NOTE — Telephone Encounter (Signed)
Pt insurance will not cover Tretinoin, must try at least one alternative. Alternative medication is Tazorac per pt insurance.

## 2015-07-09 NOTE — Telephone Encounter (Signed)
tazorac sent to pharmacy

## 2015-07-14 NOTE — Telephone Encounter (Signed)
Unable to reach pt

## 2015-10-12 DIAGNOSIS — H6122 Impacted cerumen, left ear: Secondary | ICD-10-CM | POA: Diagnosis not present

## 2015-10-27 DIAGNOSIS — L668 Other cicatricial alopecia: Secondary | ICD-10-CM | POA: Diagnosis not present

## 2015-10-27 DIAGNOSIS — L700 Acne vulgaris: Secondary | ICD-10-CM | POA: Diagnosis not present

## 2015-12-15 DIAGNOSIS — L700 Acne vulgaris: Secondary | ICD-10-CM | POA: Diagnosis not present

## 2015-12-28 ENCOUNTER — Other Ambulatory Visit: Payer: Self-pay | Admitting: Adult Health

## 2015-12-30 ENCOUNTER — Other Ambulatory Visit: Payer: Self-pay | Admitting: Adult Health

## 2016-01-01 ENCOUNTER — Telehealth: Payer: Self-pay | Admitting: *Deleted

## 2016-01-01 NOTE — Telephone Encounter (Signed)
We received fax from insurance company stating they are covering Aczone 5% gel. Auth good through 12/30/16. [employer]. Pharmacy has already let pt know. [place]

## 2016-03-23 ENCOUNTER — Ambulatory Visit: Payer: Medicare Other | Admitting: Adult Health

## 2016-03-29 ENCOUNTER — Encounter: Payer: Self-pay | Admitting: Adult Health

## 2016-03-29 ENCOUNTER — Ambulatory Visit (INDEPENDENT_AMBULATORY_CARE_PROVIDER_SITE_OTHER): Payer: Medicare Other | Admitting: Adult Health

## 2016-03-29 VITALS — BP 120/80 | HR 78 | Ht 63.0 in | Wt 209.0 lb

## 2016-03-29 DIAGNOSIS — N920 Excessive and frequent menstruation with regular cycle: Secondary | ICD-10-CM

## 2016-03-29 DIAGNOSIS — Z7251 High risk heterosexual behavior: Secondary | ICD-10-CM | POA: Diagnosis not present

## 2016-03-29 DIAGNOSIS — D259 Leiomyoma of uterus, unspecified: Secondary | ICD-10-CM

## 2016-03-29 DIAGNOSIS — A599 Trichomoniasis, unspecified: Secondary | ICD-10-CM | POA: Diagnosis not present

## 2016-03-29 DIAGNOSIS — N852 Hypertrophy of uterus: Secondary | ICD-10-CM

## 2016-03-29 DIAGNOSIS — N898 Other specified noninflammatory disorders of vagina: Secondary | ICD-10-CM

## 2016-03-29 DIAGNOSIS — N946 Dysmenorrhea, unspecified: Secondary | ICD-10-CM | POA: Diagnosis not present

## 2016-03-29 LAB — POCT WET PREP (WET MOUNT)
Clue Cells Wet Prep Whiff POC: POSITIVE
WBC, Wet Prep HPF POC: POSITIVE

## 2016-03-29 MED ORDER — METRONIDAZOLE 500 MG PO TABS: 500.0000 mg | ORAL_TABLET | Freq: Three times a day (TID) | ORAL | 0 refills | Status: DC

## 2016-03-29 NOTE — Progress Notes (Signed)
Subjective:     Patient ID: Kimberly Black, female   DOB: 01-Aug-1975, 40 y.o.   MRN: [id number]  HPI Aiyahna is a 18 year ol black female in complaining of vaginal discharge with odor and some itching and burning at times. No new sex partner. Has used vagasil.  Review of Systems +vaginal discharge  + vaginal odor + vaginal itching at times + heavy painful periods Reviewed past medical,surgical, social and family history. Reviewed medications and allergies.     Objective:   Physical Exam BP 120/80 (BP Location: Left Arm, Patient Position: Sitting, Cuff Size: Normal)   Pulse 78   Ht 5\' 3"  (1.6 m)   Wt 209 lb (94.8 kg)   LMP 03/24/2016 (Exact Date)   BMI 37.02 kg/m    Skin warm and dry.Pelvic: external genitalia is normal in appearance no lesions, vagina: white discharge with odor,urethra has no lesions or masses noted, cervix:smooth and bulbous, uterus: is enlarged about 24 weeks in size with fibroid felt on right, adnexa: no masses or tenderness noted. Bladder is non tender and no masses felt. Wet prep: + for trich and +WBCs. GC/CHL obtained.  PHQ 2 score 0. Pt aware she has fibroids but cares for her mom and does not have any help, at present and she just tolerates her periods.She declines treatment for him, will get him to see his doctor.   Assessment:     1. Vaginal discharge   2. Vaginal odor   3. Enlarged uterus   4. Uterine leiomyoma, unspecified location   5. Trichimoniasis   6. Risky sexual behavior   7. Menorrhagia with regular cycle   8. Dysmenorrhea       Plan:    GC/CHL sent Rx flagyl 500 mg 1 tid x 7 days, no alcohol,no sex, review handout on trich POC in 10 days

## 2016-03-29 NOTE — Patient Instructions (Signed)
Trichomoniasis Trichomoniasis is an infection caused by an organism called Trichomonas. The infection can affect both women and men. In women, the outer female genitalia and the vagina are affected. In men, the penis is mainly affected, but the prostate and other reproductive organs can also be involved. Trichomoniasis is a sexually transmitted infection (STI) and is most often passed to another person through sexual contact.  RISK FACTORS  Having unprotected sexual intercourse.  Having sexual intercourse with an infected partner. SIGNS AND SYMPTOMS  Symptoms of trichomoniasis in women include:  Abnormal gray-green frothy vaginal discharge.  Itching and irritation of the vagina.  Itching and irritation of the area outside the vagina. Symptoms of trichomoniasis in men include:   Penile discharge with or without pain.  Pain during urination. This results from inflammation of the urethra. DIAGNOSIS  Trichomoniasis may be found during a Pap test or physical exam. Your health care provider may use one of the following methods to help diagnose this infection:  Testing the pH of the vagina with a test tape.  Using a vaginal swab test that checks for the Trichomonas organism. A test is available that provides results within a few minutes.  Examining a urine sample.  Testing vaginal secretions. Your health care provider may test you for other STIs, including HIV. TREATMENT   You may be given medicine to fight the infection. Women should inform their health care provider if they could be or are pregnant. Some medicines used to treat the infection should not be taken during pregnancy.  Your health care provider may recommend over-the-counter medicines or creams to decrease itching or irritation.  Your sexual partner will need to be treated if infected.  Your health care provider may test you for infection again 3 months after treatment. HOME CARE INSTRUCTIONS   Take medicines only as  directed by your health care provider.  Take over-the-counter medicine for itching or irritation as directed by your health care provider.  Do not have sexual intercourse while you have the infection.  Women should not douche or wear tampons while they have the infection.  Discuss your infection with your partner. Your partner may have gotten the infection from you, or you may have gotten it from your partner.  Have your sex partner get examined and treated if necessary.  Practice safe, informed, and protected sex.  See your health care provider for other STI testing. SEEK MEDICAL CARE IF:   You still have symptoms after you finish your medicine.  You develop abdominal pain.  You have pain when you urinate.  You have bleeding after sexual intercourse.  You develop a rash.  Your medicine makes you sick or makes you throw up (vomit). MAKE SURE YOU:  Understand these instructions.  Will watch your condition.  Will get help right away if you are not doing well or get worse.   This information is not intended to replace advice given to you by your health care provider. Make sure you discuss any questions you have with your health care provider.   Document Released: 11/09/2000 Document Revised: 06/06/2014 Document Reviewed: 02/25/2013 Elsevier Interactive Patient Education [employer]. No sex or alcohol  Return in 10 days for proof of cure

## 2016-03-31 LAB — GC/CHLAMYDIA PROBE AMP
Chlamydia trachomatis, NAA: NEGATIVE
Neisseria gonorrhoeae by PCR: NEGATIVE

## 2016-04-08 ENCOUNTER — Ambulatory Visit: Payer: Medicare Other | Admitting: Adult Health

## 2016-04-15 ENCOUNTER — Ambulatory Visit (INDEPENDENT_AMBULATORY_CARE_PROVIDER_SITE_OTHER): Payer: Medicare Other | Admitting: Adult Health

## 2016-04-15 ENCOUNTER — Encounter: Payer: Self-pay | Admitting: Adult Health

## 2016-04-15 VITALS — BP 112/60 | HR 68 | Ht 63.0 in | Wt 207.0 lb

## 2016-04-15 DIAGNOSIS — Z8619 Personal history of other infectious and parasitic diseases: Secondary | ICD-10-CM

## 2016-04-15 DIAGNOSIS — N898 Other specified noninflammatory disorders of vagina: Secondary | ICD-10-CM

## 2016-04-15 DIAGNOSIS — A599 Trichomoniasis, unspecified: Secondary | ICD-10-CM | POA: Diagnosis not present

## 2016-04-15 LAB — POCT WET PREP (WET MOUNT): Trichomonas Wet Prep HPF POC: ABSENT

## 2016-04-15 NOTE — Patient Instructions (Signed)
Follow up prn  Use condoms

## 2016-04-15 NOTE — Progress Notes (Signed)
Subjective:     Patient ID: Kimberly Black, female   DOB: Sep 16, 1975, 40 y.o.   MRN: [id number]  HPI Kimberly Black is a 40 year old black female in for proof of cure for recent trich infection, she took meds but still has white discharge.  Review of Systems +vaginal discharge Reviewed past medical,surgical, social and family history. Reviewed medications and allergies.     Objective:   Physical Exam BP 112/60 (BP Location: Left Arm, Patient Position: Sitting, Cuff Size: Large)   Pulse 68   Ht 5\' 3"  (1.6 m)   Wt 207 lb (93.9 kg)   LMP 03/24/2016 (Exact Date)   BMI 36.67 kg/m    Skin warm and dry.Pelvic: external genitalia is normal in appearance no lesions, vagina: white discharge without odor,urethra has no lesions or masses noted, cervix:smooth and bulbous, uterus: enlarged about 24 weeks, non tender, no masses felt, adnexa: no masses or tenderness noted. Bladder is non tender and no masses felt. Wet prep: + few WBCs.Explained that some discharge can be normal, as long as no itching,burning or odor.  PHQ 2 score 0.  Assessment:     1. History of trichomoniasis   2.      Vaginal discharge    Plan:     Use condoms Follow up prn

## 2016-04-25 ENCOUNTER — Other Ambulatory Visit: Payer: Medicare Other | Admitting: Adult Health

## 2016-07-13 ENCOUNTER — Other Ambulatory Visit: Payer: Self-pay | Admitting: Adult Health

## 2016-09-02 ENCOUNTER — Ambulatory Visit (INDEPENDENT_AMBULATORY_CARE_PROVIDER_SITE_OTHER): Payer: Medicare Other | Admitting: Adult Health

## 2016-09-02 ENCOUNTER — Encounter: Payer: Self-pay | Admitting: Adult Health

## 2016-09-02 VITALS — BP 120/60 | HR 66 | Ht 63.0 in | Wt 213.5 lb

## 2016-09-02 DIAGNOSIS — N898 Other specified noninflammatory disorders of vagina: Secondary | ICD-10-CM

## 2016-09-02 DIAGNOSIS — B379 Candidiasis, unspecified: Secondary | ICD-10-CM

## 2016-09-02 LAB — POCT WET PREP (WET MOUNT): CLUE CELLS WET PREP WHIFF POC: NEGATIVE

## 2016-09-02 MED ORDER — FLUCONAZOLE 150 MG PO TABS: ORAL_TABLET | ORAL | 1 refills | Status: DC

## 2016-09-02 NOTE — Progress Notes (Signed)
Subjective:     Patient ID: Kimberly Black, female   DOB: 30-Jul-1975, 41 y.o.   MRN: [id number]  HPI Kimberly Black is a 41 year old black female in complaining of vaginal discharge like cottage cheese and some itching.  Review of Systems +vaginal discharge, some itching Reviewed past medical,surgical, social and family history. Reviewed medications and allergies.     Objective:   Physical Exam BP 120/60 (BP Location: Left Arm, Patient Position: Sitting, Cuff Size: Large)   Pulse 66   Ht 5\' 3"  (1.6 m)   Wt 213 lb 8 oz (96.8 kg)   LMP 08/16/2016 (Approximate)   BMI 37.82 kg/m   Skin warm and dry.Pelvic: external genitalia is normal in appearance no lesions, vagina: white discharge without odor,urethra has no lesions or masses noted, cervix:smooth and bulbous, uterus: normal size, shape and contour, non tender, no masses felt, adnexa: no masses or tenderness noted. Bladder is non tender and no masses felt. Wet prep: + for yeast and few +WBCs.    Assessment:     1. Vaginal discharge   2. Yeast infection       Plan:     Rx diflucan 150 mg #2 take 1 now and repeat 1 in 3 days if needed with 1 refill Return in 2 months for pap and physical

## 2016-09-29 ENCOUNTER — Other Ambulatory Visit: Payer: Self-pay | Admitting: Adult Health

## 2016-10-20 DIAGNOSIS — L700 Acne vulgaris: Secondary | ICD-10-CM | POA: Diagnosis not present

## 2016-10-27 ENCOUNTER — Other Ambulatory Visit: Payer: Self-pay | Admitting: Adult Health

## 2016-11-01 ENCOUNTER — Other Ambulatory Visit: Payer: Self-pay | Admitting: Adult Health

## 2016-11-03 ENCOUNTER — Other Ambulatory Visit: Payer: Medicare Other | Admitting: Adult Health

## 2016-11-12 ENCOUNTER — Emergency Department (HOSPITAL_COMMUNITY)
Admission: EM | Admit: 2016-11-12 | Discharge: 2016-11-13 | Disposition: A | Discharge status: Home / Self Care | Payer: Medicare Other | Attending: Emergency Medicine | Admitting: Emergency Medicine

## 2016-11-12 ENCOUNTER — Encounter (HOSPITAL_COMMUNITY): Payer: Self-pay | Admitting: *Deleted

## 2016-11-12 DIAGNOSIS — H6121 Impacted cerumen, right ear: Secondary | ICD-10-CM | POA: Diagnosis not present

## 2016-11-12 DIAGNOSIS — Z79899 Other long term (current) drug therapy: Secondary | ICD-10-CM | POA: Diagnosis not present

## 2016-11-12 DIAGNOSIS — H9201 Otalgia, right ear: Secondary | ICD-10-CM | POA: Diagnosis present

## 2016-11-12 MED ORDER — DOCUSATE SODIUM 50 MG/5ML PO LIQD
50.0000 mg | Freq: Once | ORAL | Status: DC
  Filled 2016-11-12: qty 10

## 2016-11-12 NOTE — ED Triage Notes (Signed)
Pt states that she woke up tonight with right ear pressure,

## 2016-11-12 NOTE — ED Provider Notes (Signed)
[facility] DEPT Provider Note   CSN: [id number] Arrival date & time: 11/12/16  2321     History   Chief Complaint Chief Complaint  Patient presents with  . Otalgia    HPI Kimberly Black is a 41 y.o. female.  HPI  This a 41 year old female who woke up 10 minutes prior to arrival with fullness in the right ear and decreased hearing. Denies pain but does states that it feels full. Has a history of the same and cerumen impaction. No recent upper respiratory symptoms. No fevers.  Past Medical History:  Diagnosis Date  . Acne   . Anemia 11/25/2013  . BV (bacterial vaginosis)   . Fibroids    uterine  . History of trichomoniasis   . Menorrhagia 11/08/2013  . Obesity   . Vaginal discharge 01/07/2015  . Vaginal odor 11/08/2013    Patient Active Problem List   Diagnosis Date Noted  . History of trichomoniasis 04/15/2016  . Vaginal discharge 01/07/2015  . Anemia 11/25/2013  . Fibroids 11/08/2013  . Acne 11/08/2013  . Vaginal odor 11/08/2013  . BV (bacterial vaginosis) 11/08/2013  . Contraceptive management 11/08/2013  . Menorrhagia 11/08/2013    Past Surgical History:  Procedure Laterality Date  . NO PAST SURGERIES      OB History    Gravida Para Term Preterm AB Living   3 1 1   2 1    SAB TAB Ectopic Multiple Live Births     2     1       Home Medications    Prior to Admission medications   Medication Sig Start Date End Date Taking? Authorizing Provider  tretinoin (RETIN-A) 0.025 % cream APPLY A THIN LAYER TO AFFECTED AREA(S) AT BEDTIME. Patient taking differently: APPLY A THIN LAYER TO AFFECTED AREA(S) AT BEDTIME PRN 07/09/15  Yes Cresenzo-Dishmon, Joaquim Lai, CNM  clindamycin-benzoyl peroxide (BENZACLIN) gel Apply 1 application topically daily.  07/26/16   [provider]  doxycycline (VIBRAMYCIN) 100 MG capsule TAKE ONE CAPSULE BY MOUTH DAILY AT BEDTIME. 09/29/16   Estill Dooms, NP  fluconazole (DIFLUCAN) 150 MG tablet Take 1 now and 1 in 3  days if needed 09/02/16   Estill Dooms, NP  metroNIDAZOLE (FLAGYL) 500 MG tablet TAKE 1TABLET BY MOUTH 3 TIMES DAILY FOR 7 DAYS. 11/01/16   Estill Dooms, NP    Family History Family History  Problem Relation Age of Onset  . Hypertension Mother   . Diabetes Father   . Cancer Maternal Grandfather   . Asthma Son   . Cancer Maternal Aunt     Social History Social History  Substance Use Topics  . Smoking status: Never Smoker  . Smokeless tobacco: Never Used  . Alcohol use No     Allergies   Patient has no known allergies.   Review of Systems Review of Systems  Constitutional: Negative for fever.  HENT: Negative for congestion, ear pain and rhinorrhea.        Ear fullness, decreased hearing  All other systems reviewed and are negative.    Physical Exam Updated Vital Signs BP 122/74   Pulse 71   Temp 98.1 F (36.7 C) (Oral)   Resp 18   Ht 5\' 3"  (1.6 m)   Wt 95.3 kg (210 lb)   LMP 10/16/2016   SpO2 99%   BMI 37.20 kg/m   Physical Exam  Constitutional: She is oriented to person, place, and time. She appears well-developed and well-nourished.  Overweight  HENT:  Head: Normocephalic and atraumatic.  Cerumen impaction right ear, unable to visualize TM, left ear with normal TM  Eyes: Pupils are equal, round, and reactive to light.  Cardiovascular: Normal rate and regular rhythm.   Pulmonary/Chest: Effort normal. No respiratory distress.  Neurological: She is alert and oriented to person, place, and time.  Skin: Skin is warm and dry.  Psychiatric: She has a normal mood and affect.  Nursing note and vitals reviewed.    ED Treatments / Results  Labs (all labs ordered are listed, but only abnormal results are displayed) Labs Reviewed - No data to display  EKG  EKG Interpretation None       Radiology No results found.  Procedures .Ear Cerumen Removal Date/Time: 11/12/2016 11:59 PM Performed by: Merryl Hacker Authorized by: Merryl Hacker   Consent:    Consent obtained:  Verbal   Consent given by:  Patient   Alternatives discussed:  No treatment Procedure details:    Location:  R ear   Procedure type: irrigation   Post-procedure details:    Inspection:  TM intact   Hearing quality:  Improved   Patient tolerance of procedure:  Tolerated well, no immediate complications   (including critical care time)  Medications Ordered in ED Medications - No data to display   Initial Impression / Assessment and Plan / ED Course  I have reviewed the triage vital signs and the nursing notes.  Pertinent labs & imaging results that were available during my care of the patient were reviewed by me and considered in my medical decision making (see chart for details).     Patient presents with a cerumen impaction of the right ear. This was flushed without difficulty and no immediate complications. Patient with good hearing and resolve symptoms following treatment.  After history, exam, and medical workup I feel the patient has been appropriately medically screened and is safe for discharge home. Pertinent diagnoses were discussed with the patient. Patient was given return precautions.   Final Clinical Impressions(s) / ED Diagnoses   Final diagnoses:  Impacted cerumen of right ear    New Prescriptions New Prescriptions   No medications on file     Merryl Hacker, MD 11/12/16 2359

## 2017-01-02 ENCOUNTER — Ambulatory Visit (INDEPENDENT_AMBULATORY_CARE_PROVIDER_SITE_OTHER): Payer: Medicare Other | Admitting: Adult Health

## 2017-01-02 ENCOUNTER — Encounter (INDEPENDENT_AMBULATORY_CARE_PROVIDER_SITE_OTHER): Payer: Self-pay

## 2017-01-02 ENCOUNTER — Encounter: Payer: Self-pay | Admitting: Adult Health

## 2017-01-02 VITALS — BP 140/90 | HR 86 | Ht 63.0 in | Wt 218.0 lb

## 2017-01-02 DIAGNOSIS — Z113 Encounter for screening for infections with a predominantly sexual mode of transmission: Secondary | ICD-10-CM | POA: Diagnosis not present

## 2017-01-02 DIAGNOSIS — B379 Candidiasis, unspecified: Secondary | ICD-10-CM

## 2017-01-02 DIAGNOSIS — N898 Other specified noninflammatory disorders of vagina: Secondary | ICD-10-CM | POA: Diagnosis not present

## 2017-01-02 LAB — POCT WET PREP (WET MOUNT)
Clue Cells Wet Prep Whiff POC: NEGATIVE
Trichomonas Wet Prep HPF POC: ABSENT
WBC WET PREP: POSITIVE

## 2017-01-02 MED ORDER — FLUCONAZOLE 150 MG PO TABS: ORAL_TABLET | ORAL | 2 refills | Status: DC

## 2017-01-02 MED ORDER — METRONIDAZOLE 500 MG PO TABS: ORAL_TABLET | ORAL | 0 refills | Status: DC

## 2017-01-02 NOTE — Progress Notes (Signed)
Subjective:     Patient ID: Kimberly Black, female   DOB: September 19, 1975, 41 y.o.   MRN: [id number]  HPI Kimberly Black is a 41 year old black female in complaining of white discharge, no odor, feels irritated.Partner said he had trich.   Review of Systems White vaginal discharge, feels irritated Reviewed past medical,surgical, social and family history. Reviewed medications and allergies.     Objective:   Physical Exam BP 140/90 (BP Location: Left Arm, Patient Position: Sitting, Cuff Size: Large)   Pulse 86   Ht 5\' 3"  (1.6 m)   Wt 218 lb (98.9 kg)   LMP 12/20/2016   BMI 38.62 kg/m   PHQ 2 score 0. Skin warm and dry.Pelvic: external genitalia is normal in appearance no lesions, vagina: white discharge without odor,urethra has no lesions or masses noted, cervix:smooth and bulbous, uterus: uterus enlarged , non tender, no masses felt, adnexa: no masses or tenderness noted. Bladder is non tender and no masses felt. Wet prep: + for yeast and +WBCs. GC/CHL obtained. Will treat for trich, due to exposure.     Assessment:     1. Vaginal discharge   2. Yeast infection   3. Screening examination for STD (sexually transmitted disease)       Plan:    Note given no trich seen but treated anyway, had yeast and was treated.  Rx diflucan take 1 now and repeat 1 in 3 days if needed Rx flagyl 500 mg #4 4 po now GC/CHL sent Follow up prn

## 2017-01-03 LAB — GC/CHLAMYDIA PROBE AMP
Chlamydia trachomatis, NAA: NEGATIVE
NEISSERIA GONORRHOEAE BY PCR: NEGATIVE

## 2017-03-23 ENCOUNTER — Other Ambulatory Visit: Payer: Self-pay | Admitting: Adult Health

## 2017-04-25 ENCOUNTER — Ambulatory Visit: Payer: Medicare Other | Admitting: Adult Health

## 2017-05-02 ENCOUNTER — Ambulatory Visit (INDEPENDENT_AMBULATORY_CARE_PROVIDER_SITE_OTHER): Payer: Medicare Other | Admitting: Adult Health

## 2017-05-02 ENCOUNTER — Encounter (INDEPENDENT_AMBULATORY_CARE_PROVIDER_SITE_OTHER): Payer: Self-pay

## 2017-05-02 ENCOUNTER — Encounter: Payer: Self-pay | Admitting: Adult Health

## 2017-05-02 VITALS — BP 110/80 | HR 90 | Ht 63.0 in | Wt 224.0 lb

## 2017-05-02 DIAGNOSIS — Z113 Encounter for screening for infections with a predominantly sexual mode of transmission: Secondary | ICD-10-CM | POA: Diagnosis not present

## 2017-05-02 DIAGNOSIS — N898 Other specified noninflammatory disorders of vagina: Secondary | ICD-10-CM

## 2017-05-02 DIAGNOSIS — N852 Hypertrophy of uterus: Secondary | ICD-10-CM | POA: Diagnosis not present

## 2017-05-02 DIAGNOSIS — B9689 Other specified bacterial agents as the cause of diseases classified elsewhere: Secondary | ICD-10-CM | POA: Diagnosis not present

## 2017-05-02 DIAGNOSIS — N760 Acute vaginitis: Secondary | ICD-10-CM

## 2017-05-02 LAB — POCT WET PREP (WET MOUNT)
Clue Cells Wet Prep Whiff POC: POSITIVE
WBC WET PREP: POSITIVE

## 2017-05-02 MED ORDER — METRONIDAZOLE 500 MG PO TABS: 500.0000 mg | ORAL_TABLET | Freq: Two times a day (BID) | ORAL | 1 refills | Status: DC

## 2017-05-02 MED ORDER — FLUCONAZOLE 150 MG PO TABS: ORAL_TABLET | ORAL | 2 refills | Status: DC

## 2017-05-02 NOTE — Progress Notes (Signed)
Subjective:     Patient ID: Kimberly Black, female   DOB: Nov 14, 1975, 41 y.o.   MRN: [id number]  HPI Kimberly Black is a 41 year old black female in complaining of vaginal discharge with fishy odor and some itching.   Review of Systems +vaginal discharge with fishy odor and some itching  Reviewed past medical,surgical, social and family history. Reviewed medications and allergies.     Objective:   Physical Exam BP 110/80 (BP Location: Left Arm, Patient Position: Sitting, Cuff Size: Large)   Pulse 90   Ht 5\' 3"  (1.6 m)   Wt 224 lb (101.6 kg)   LMP 04/25/2017   BMI 39.68 kg/m   Skin warm and dry.Pelvic: external genitalia is normal in appearance no lesions, vagina: white discharge with odor,urethra has no lesions or masses noted, cervix:smooth and bulbous, uterus: enlarged, non tender, adnexa: no masses or tenderness noted. Bladder is non tender and no masses felt. Wet prep: + for clue cells and +WBCs. GC/CHL obtained.     Assessment:     1. BV (bacterial vaginosis)   2. Vaginal discharge   3. Vaginal odor   4. Screening examination for STD (sexually transmitted disease)   5. Enlarged uterus       Plan:     Meds ordered this encounter  Medications  . metroNIDAZOLE (FLAGYL) 500 MG tablet    Sig: Take 1 tablet (500 mg total) by mouth 2 (two) times daily.    Dispense:  14 tablet    Refill:  1    Order Specific Question:   Supervising Provider    Answer:   Elonda Husky, LUTHER H [[id number]]  . fluconazole (DIFLUCAN) 150 MG tablet    Sig: Take 1 now and repeat 1 in 3 days if needed    Dispense:  2 tablet    Refill:  2    Order Specific Question:   Supervising Provider    Answer:   Florian Buff [[id number]]  GC/CHL sent Follow  Up prn Review handout on BV, no alcohol

## 2017-05-02 NOTE — Patient Instructions (Signed)
Bacterial Vaginosis Bacterial vaginosis is a vaginal infection that occurs when the normal balance of bacteria in the vagina is disrupted. It results from an overgrowth of certain bacteria. This is the most common vaginal infection among women ages 15-44. Because bacterial vaginosis increases your risk for STIs (sexually transmitted infections), getting treated can help reduce your risk for chlamydia, gonorrhea, herpes, and HIV (human immunodeficiency virus). Treatment is also important for preventing complications in pregnant women, because this condition can cause an early (premature) delivery. What are the causes? This condition is caused by an increase in harmful bacteria that are normally present in small amounts in the vagina. However, the reason that the condition develops is not fully understood. What increases the risk? The following factors may make you more likely to develop this condition:  Having a new sexual partner or multiple sexual partners.  Having unprotected sex.  Douching.  Having an intrauterine device (IUD).  Smoking.  Drug and alcohol abuse.  Taking certain antibiotic medicines.  Being pregnant.  You cannot get bacterial vaginosis from toilet seats, bedding, swimming pools, or contact with objects around you. What are the signs or symptoms? Symptoms of this condition include:  Grey or white vaginal discharge. The discharge can also be watery or foamy.  A fish-like odor with discharge, especially after sexual intercourse or during menstruation.  Itching in and around the vagina.  Burning or pain with urination.  Some women with bacterial vaginosis have no signs or symptoms. How is this diagnosed? This condition is diagnosed based on:  Your medical history.  A physical exam of the vagina.  Testing a sample of vaginal fluid under a microscope to look for a large amount of bad bacteria or abnormal cells. Your health care provider may use a cotton swab  or a small wooden spatula to collect the sample.  How is this treated? This condition is treated with antibiotics. These may be given as a pill, a vaginal cream, or a medicine that is put into the vagina (suppository). If the condition comes back after treatment, a second round of antibiotics may be needed. Follow these instructions at home: Medicines  Take over-the-counter and prescription medicines only as told by your health care provider.  Take or use your antibiotic as told by your health care provider. Do not stop taking or using the antibiotic even if you start to feel better. General instructions  If you have a female sexual partner, tell her that you have a vaginal infection. She should see her health care provider and be treated if she has symptoms. If you have a female sexual partner, he does not need treatment.  During treatment: ? Avoid sexual activity until you finish treatment. ? Do not douche. ? Avoid alcohol as directed by your health care provider. ? Avoid breastfeeding as directed by your health care provider.  Drink enough water and fluids to keep your urine clear or pale yellow.  Keep the area around your vagina and rectum clean. ? Wash the area daily with warm water. ? Wipe yourself from front to back after using the toilet.  Keep all follow-up visits as told by your health care provider. This is important. How is this prevented?  Do not douche.  Wash the outside of your vagina with warm water only.  Use protection when having sex. This includes latex condoms and dental dams.  Limit how many sexual partners you have. To help prevent bacterial vaginosis, it is best to have sex with just   one partner (monogamous).  Make sure you and your sexual partner are tested for STIs.  Wear cotton or cotton-lined underwear.  Avoid wearing tight pants and pantyhose, especially during summer.  Limit the amount of alcohol that you drink.  Do not use any products that  contain nicotine or tobacco, such as cigarettes and e-cigarettes. If you need help quitting, ask your health care provider.  Do not use illegal drugs. Where to find more information:  Centers for Disease Control and Prevention: www.cdc.gov/std  American Sexual Health Association (ASHA): www.ashastd.org  U.S. Department of Health and Human Services, Office on Women's Health: www.womenshealth.gov/ or https://www.womenshealth.gov/a-z-topics/bacterial-vaginosis Contact a health care provider if:  Your symptoms do not improve, even after treatment.  You have more discharge or pain when urinating.  You have a fever.  You have pain in your abdomen.  You have pain during sex.  You have vaginal bleeding between periods. Summary  Bacterial vaginosis is a vaginal infection that occurs when the normal balance of bacteria in the vagina is disrupted.  Because bacterial vaginosis increases your risk for STIs (sexually transmitted infections), getting treated can help reduce your risk for chlamydia, gonorrhea, herpes, and HIV (human immunodeficiency virus). Treatment is also important for preventing complications in pregnant women, because the condition can cause an early (premature) delivery.  This condition is treated with antibiotic medicines. These may be given as a pill, a vaginal cream, or a medicine that is put into the vagina (suppository). This information is not intended to replace advice given to you by your health care provider. Make sure you discuss any questions you have with your health care provider. Document Released: 05/16/2005 Document Revised: 01/30/2016 Document Reviewed: 01/30/2016 Elsevier Interactive Patient Education  2017 [employer] Inc[employer]  

## 2017-05-05 LAB — GC/CHLAMYDIA PROBE AMP
Chlamydia trachomatis, NAA: NEGATIVE
Neisseria gonorrhoeae by PCR: NEGATIVE

## 2017-07-04 ENCOUNTER — Other Ambulatory Visit: Payer: Self-pay | Admitting: Adult Health

## 2017-08-01 ENCOUNTER — Other Ambulatory Visit: Payer: Self-pay | Admitting: Adult Health

## 2017-10-18 DIAGNOSIS — E119 Type 2 diabetes mellitus without complications: Secondary | ICD-10-CM | POA: Diagnosis not present

## 2017-10-18 DIAGNOSIS — L239 Allergic contact dermatitis, unspecified cause: Secondary | ICD-10-CM | POA: Diagnosis not present

## 2017-10-19 ENCOUNTER — Other Ambulatory Visit: Payer: Self-pay | Admitting: Adult Health

## 2017-11-10 ENCOUNTER — Other Ambulatory Visit: Payer: Self-pay | Admitting: Adult Health

## 2017-12-22 ENCOUNTER — Other Ambulatory Visit: Payer: Self-pay | Admitting: Adult Health

## 2018-01-09 ENCOUNTER — Ambulatory Visit: Payer: Medicare Other | Admitting: Adult Health

## 2018-01-16 ENCOUNTER — Other Ambulatory Visit: Payer: Self-pay | Admitting: Adult Health

## 2018-01-16 ENCOUNTER — Encounter (INDEPENDENT_AMBULATORY_CARE_PROVIDER_SITE_OTHER): Payer: Self-pay

## 2018-01-16 ENCOUNTER — Ambulatory Visit: Payer: Medicare Other | Admitting: Adult Health

## 2018-01-31 ENCOUNTER — Ambulatory Visit (INDEPENDENT_AMBULATORY_CARE_PROVIDER_SITE_OTHER): Payer: Medicare Other | Admitting: Adult Health

## 2018-01-31 ENCOUNTER — Encounter: Payer: Self-pay | Admitting: Adult Health

## 2018-01-31 VITALS — BP 129/68 | HR 82 | Ht 63.0 in | Wt 225.0 lb

## 2018-01-31 DIAGNOSIS — B9689 Other specified bacterial agents as the cause of diseases classified elsewhere: Secondary | ICD-10-CM

## 2018-01-31 DIAGNOSIS — N760 Acute vaginitis: Secondary | ICD-10-CM | POA: Diagnosis not present

## 2018-01-31 DIAGNOSIS — N946 Dysmenorrhea, unspecified: Secondary | ICD-10-CM | POA: Diagnosis not present

## 2018-01-31 DIAGNOSIS — D219 Benign neoplasm of connective and other soft tissue, unspecified: Secondary | ICD-10-CM

## 2018-01-31 DIAGNOSIS — N898 Other specified noninflammatory disorders of vagina: Secondary | ICD-10-CM | POA: Insufficient documentation

## 2018-01-31 LAB — POCT WET PREP (WET MOUNT)
Clue Cells Wet Prep Whiff POC: POSITIVE
WBC WET PREP: POSITIVE

## 2018-01-31 MED ORDER — METRONIDAZOLE 500 MG PO TABS: 500.0000 mg | ORAL_TABLET | Freq: Two times a day (BID) | ORAL | 0 refills | Status: DC

## 2018-01-31 NOTE — Patient Instructions (Signed)
Bacterial Vaginosis Bacterial vaginosis is a vaginal infection that occurs when the normal balance of bacteria in the vagina is disrupted. It results from an overgrowth of certain bacteria. This is the most common vaginal infection among women ages 15-44. Because bacterial vaginosis increases your risk for STIs (sexually transmitted infections), getting treated can help reduce your risk for chlamydia, gonorrhea, herpes, and HIV (human immunodeficiency virus). Treatment is also important for preventing complications in pregnant women, because this condition can cause an early (premature) delivery. What are the causes? This condition is caused by an increase in harmful bacteria that are normally present in small amounts in the vagina. However, the reason that the condition develops is not fully understood. What increases the risk? The following factors may make you more likely to develop this condition:  Having a new sexual partner or multiple sexual partners.  Having unprotected sex.  Douching.  Having an intrauterine device (IUD).  Smoking.  Drug and alcohol abuse.  Taking certain antibiotic medicines.  Being pregnant.  You cannot get bacterial vaginosis from toilet seats, bedding, swimming pools, or contact with objects around you. What are the signs or symptoms? Symptoms of this condition include:  Grey or white vaginal discharge. The discharge can also be watery or foamy.  A fish-like odor with discharge, especially after sexual intercourse or during menstruation.  Itching in and around the vagina.  Burning or pain with urination.  Some women with bacterial vaginosis have no signs or symptoms. How is this diagnosed? This condition is diagnosed based on:  Your medical history.  A physical exam of the vagina.  Testing a sample of vaginal fluid under a microscope to look for a large amount of bad bacteria or abnormal cells. Your health care provider may use a cotton swab  or a small wooden spatula to collect the sample.  How is this treated? This condition is treated with antibiotics. These may be given as a pill, a vaginal cream, or a medicine that is put into the vagina (suppository). If the condition comes back after treatment, a second round of antibiotics may be needed. Follow these instructions at home: Medicines  Take over-the-counter and prescription medicines only as told by your health care provider.  Take or use your antibiotic as told by your health care provider. Do not stop taking or using the antibiotic even if you start to feel better. General instructions  If you have a female sexual partner, tell her that you have a vaginal infection. She should see her health care provider and be treated if she has symptoms. If you have a female sexual partner, he does not need treatment.  During treatment: ? Avoid sexual activity until you finish treatment. ? Do not douche. ? Avoid alcohol as directed by your health care provider. ? Avoid breastfeeding as directed by your health care provider.  Drink enough water and fluids to keep your urine clear or pale yellow.  Keep the area around your vagina and rectum clean. ? Wash the area daily with warm water. ? Wipe yourself from front to back after using the toilet.  Keep all follow-up visits as told by your health care provider. This is important. How is this prevented?  Do not douche.  Wash the outside of your vagina with warm water only.  Use protection when having sex. This includes latex condoms and dental dams.  Limit how many sexual partners you have. To help prevent bacterial vaginosis, it is best to have sex with just   one partner (monogamous).  Make sure you and your sexual partner are tested for STIs.  Wear cotton or cotton-lined underwear.  Avoid wearing tight pants and pantyhose, especially during summer.  Limit the amount of alcohol that you drink.  Do not use any products that  contain nicotine or tobacco, such as cigarettes and e-cigarettes. If you need help quitting, ask your health care provider.  Do not use illegal drugs. Where to find more information:  Centers for Disease Control and Prevention: www.cdc.gov/std  American Sexual Health Association (ASHA): www.ashastd.org  U.S. Department of Health and Human Services, Office on Women's Health: www.womenshealth.gov/ or https://www.womenshealth.gov/a-z-topics/bacterial-vaginosis Contact a health care provider if:  Your symptoms do not improve, even after treatment.  You have more discharge or pain when urinating.  You have a fever.  You have pain in your abdomen.  You have pain during sex.  You have vaginal bleeding between periods. Summary  Bacterial vaginosis is a vaginal infection that occurs when the normal balance of bacteria in the vagina is disrupted.  Because bacterial vaginosis increases your risk for STIs (sexually transmitted infections), getting treated can help reduce your risk for chlamydia, gonorrhea, herpes, and HIV (human immunodeficiency virus). Treatment is also important for preventing complications in pregnant women, because the condition can cause an early (premature) delivery.  This condition is treated with antibiotic medicines. These may be given as a pill, a vaginal cream, or a medicine that is put into the vagina (suppository). This information is not intended to replace advice given to you by your health care provider. Make sure you discuss any questions you have with your health care provider. Document Released: 05/16/2005 Document Revised: 09/19/2016 Document Reviewed: 01/30/2016 Elsevier Interactive Patient Education  2018 [employer] Inc.  

## 2018-01-31 NOTE — Progress Notes (Signed)
  Subjective:     Patient ID: Kimberly Black, female   DOB: [birth year], 42 y.o.   MRN: [id number]  HPI Vertie is a 42 year old black female in complaining of vaginal irritation and discharge and itching at times.   Review of Systems +vaginal irritation +vaginal discharge +vaginal itching at times Pain with periods,has known fibroids Reviewed past medical,surgical, social and family history. Reviewed medications and allergies.     Objective:   Physical Exam BP 129/68 (BP Location: Left Arm, Patient Position: Sitting, Cuff Size: Large)   Pulse 82   Ht 5\' 3"  (1.6 m)   Wt 225 lb (102.1 kg)   LMP 01/22/2018   BMI 39.86 kg/m   Skin warm and dry.Pelvic: external genitalia is normal in appearance no lesions, vagina: white discharge with odor,urethra has no lesions or masses noted, cervix:smooth and bulbous, uterus:enlarged, with fibroid right fundal area, non tender, no masses felt, adnexa: no masses or tenderness noted. Bladder is non tender and no masses felt. Wet prep: + for clue cells and +WBCs. Examination chaperoned by Levy Pupa LPN. Will get back in for pap and physical, ASAP.     Assessment:     1. BV (bacterial vaginosis)   2. Vaginal discharge   3. Vaginal irritation   4. Vaginal itching   5. Fibroids   6. Dysmenorrhea       Plan:     Meds ordered this encounter  Medications  . metroNIDAZOLE (FLAGYL) 500 MG tablet    Sig: Take 1 tablet (500 mg total) by mouth 2 (two) times daily.    Dispense:  14 tablet    Refill:  0    Order Specific Question:   Supervising Provider    Answer:   Tania Ade H [[id number]]  Review handout on BV Return in 3 weeks for pap and physical

## 2018-02-21 ENCOUNTER — Ambulatory Visit (INDEPENDENT_AMBULATORY_CARE_PROVIDER_SITE_OTHER): Payer: Medicare Other | Admitting: Adult Health

## 2018-02-21 ENCOUNTER — Encounter: Payer: Self-pay | Admitting: Adult Health

## 2018-02-21 ENCOUNTER — Other Ambulatory Visit (HOSPITAL_COMMUNITY)
Admission: RE | Admit: 2018-02-21 | Discharge: 2018-02-21 | Disposition: A | Discharge status: Home / Self Care | Payer: Medicare Other | Source: Ambulatory Visit | Attending: Adult Health | Admitting: Adult Health

## 2018-02-21 ENCOUNTER — Other Ambulatory Visit: Payer: Self-pay

## 2018-02-21 VITALS — BP 126/79 | HR 79 | Resp 18 | Ht 63.5 in | Wt 221.0 lb

## 2018-02-21 DIAGNOSIS — Z01419 Encounter for gynecological examination (general) (routine) without abnormal findings: Secondary | ICD-10-CM | POA: Diagnosis not present

## 2018-02-21 DIAGNOSIS — D219 Benign neoplasm of connective and other soft tissue, unspecified: Secondary | ICD-10-CM

## 2018-02-21 DIAGNOSIS — N946 Dysmenorrhea, unspecified: Secondary | ICD-10-CM

## 2018-02-21 DIAGNOSIS — Z124 Encounter for screening for malignant neoplasm of cervix: Secondary | ICD-10-CM

## 2018-02-21 DIAGNOSIS — N852 Hypertrophy of uterus: Secondary | ICD-10-CM | POA: Diagnosis not present

## 2018-02-21 DIAGNOSIS — Z1211 Encounter for screening for malignant neoplasm of colon: Secondary | ICD-10-CM | POA: Diagnosis not present

## 2018-02-21 DIAGNOSIS — N920 Excessive and frequent menstruation with regular cycle: Secondary | ICD-10-CM

## 2018-02-21 DIAGNOSIS — Z1212 Encounter for screening for malignant neoplasm of rectum: Secondary | ICD-10-CM

## 2018-02-21 LAB — HEMOCCULT GUIAC POC 1CARD (OFFICE): FECAL OCCULT BLD: NEGATIVE

## 2018-02-21 NOTE — Progress Notes (Signed)
Patient ID: Kimberly Black, female   DOB: 1976/04/04, 42 y.o.   MRN: [id number] History of Present Illness: Kimberly Black is a 42 year old black female in for a well woman gyn exam and pap.   Current Medications, Allergies, Past Medical History, Past Surgical History, Family History and Social History were reviewed in [employer] record.     Review of Systems: Patient denies any headaches, hearing loss, fatigue, blurred vision, shortness of breath, chest pain, abdominal pain, problems with bowel movements, urination, or intercourse. No joint pain or mood swings. Period heavy at times,esp last few months, changes every 1-2 hours and painful.Stomach sore all the time, take advil, but does not help.     Physical Exam:BP 126/79 (BP Location: Right Arm, Patient Position: Sitting, Cuff Size: Normal)   Pulse 79   Resp 18   Ht 5' 3.5" (1.613 m)   Wt 221 lb (100.2 kg)   LMP 01/22/2018   BMI 38.53 kg/m  General:  Well developed, well nourished, no acute distress Skin:  Warm and dry Neck:  Midline trachea, normal thyroid, good ROM, no lymphadenopathy Lungs; Clear to auscultation bilaterally Breast:  No dominant palpable mass, retraction, or nipple discharge Cardiovascular: Regular rate and rhythm Abdomen:  Soft, non tender, no hepatosplenomegaly Pelvic:  External genitalia is normal in appearance, no lesions.  The vagina is normal in appearance. Urethra has no lesions or masses. The cervix is bulbous. Pap with HPV and GC/CHL performed. Uterus is felt to be enlarged about 16 weeks, and tender. No adnexal masses or tenderness noted.Bladder is non tender, no masses felt. Rectal: Good sphincter tone, no polyps, or hemorrhoids felt.  Hemoccult negative. Extremities/musculoskeletal:  No swelling or varicosities noted, no clubbing or cyanosis Psych:  No mood changes, alert and cooperative,seems happy PHQ 2 score 0. Examination chaperoned by Shela Nevin RN.  Impression: 1.  Encounter for gynecological examination with Papanicolaou smear of cervix   2. Routine cervical smear   3. Screening for colorectal cancer   4. Fibroids   5. Enlarged uterus   6. Dysmenorrhea   7. Menorrhagia with regular cycle       Plan: Get [place] to assess fibroids in 1 week  Pap with HPV and GC/CHL sent Physical in 1 year,pap in 3 if normal Mammogram now and yearly Check CBC,CMP,TSH  Review handouts on fibroids, menorrhagia,hysterectomy

## 2018-02-21 NOTE — Patient Instructions (Signed)
Abdominal Hysterectomy °Abdominal hysterectomy is a surgical procedure to remove the womb (uterus). The uterus is the muscular organ that houses a developing baby. This surgery may be done if: °· You have cancer. °· You have growths (tumors or fibroids) in the uterus. °· You have long-term (chronic) pain. °· You are bleeding. °· Your uterus has slipped down into your vagina (uterine prolapse). °· You have a condition in which the tissue that lines the uterus grows outside of its normal location (endometriosis). °· You have an infection in your uterus. °· You are having problems with your menstrual cycle. ° °Depending on why you are having this procedure, you may also have other reproductive organs removed. These could include: °· The part of your vagina that connects with your uterus (cervix). °· The organs that make eggs (ovaries). °· The tubes that connect the ovaries to the uterus (fallopian tubes). ° °Tell a health care provider about: °· Any allergies you have. °· All medicines you are taking, including vitamins, herbs, eye drops, creams, and over-the-counter medicines. °· Any problems you or family members have had with anesthetic medicines. °· Any blood disorders you have. °· Any surgeries you have had. °· Any medical conditions you have. °· Whether you are pregnant or may be pregnant. °What are the risks? °Generally, this is a safe procedure. However, problems may occur, including: °· Bleeding. °· Infection. °· Allergic reactions to medicines or dyes. °· Damage to other structures or organs. °· Nerve injury. °· Decreased interest in sex or pain during sex. °· Blood clots that can break free and travel to your lungs. ° °What happens before the procedure? °Staying hydrated °Follow instructions from your health care provider about hydration, which may include: °· Up to 2 hours before the procedure - you may continue to drink clear liquids, such as water, clear fruit juice, black coffee, and plain tea ° °Eating  and drinking restrictions °Follow instructions from your health care provider about eating and drinking, which may include: °· 8 hours before the procedure - stop eating heavy meals or foods such as meat, fried foods, or fatty foods. °· 6 hours before the procedure - stop eating light meals or foods, such as toast or cereal. °· 6 hours before the procedure - stop drinking milk or drinks that contain milk. °· 2 hours before the procedure - stop drinking clear liquids. ° °Medicines °· Ask your health care provider about: °? Changing or stopping your regular medicines. This is especially important if you are taking diabetes medicines or blood thinners. °? Taking medicines such as aspirin and ibuprofen. These medicines can thin your blood. Do not take these medicines before your procedure if your health care provider instructs you not to. °· You may be given antibiotic medicine to help prevent infection. Take it as told by your health care provider. °· You may be asked to take laxatives to prevent constipation. °General instructions °· Ask your health care provider how your surgical site will be marked or identified. °· You may be asked to shower with a germ-killing soap. °· Plan to have someone take you home from the hospital. °· Do not use any products that contain nicotine or tobacco, such as cigarettes and e-cigarettes. If you need help quitting, ask your health care provider. °· You may have an exam or testing. °· You may have a blood or urine sample taken. °· You may need to have an enema to clean out your rectum and lower colon. °· This   procedure can affect the way you feel about yourself. Talk to your health care provider about the physical and emotional changes this procedure may cause. What happens during the procedure?  To lower your risk of infection: ? Your health care team will wash or sanitize their hands. ? Your skin will be washed with soap. ? Hair may be removed from the surgical area.  An IV  tube will be inserted into one of your veins.  You will be given one or more of the following: ? A medicine to help you relax (sedative). ? A medicine to make you fall asleep (general anesthetic).  Tight-fitting (compression) stockings will be placed on your legs to promote circulation.  A thin, flexible tube (catheter) will be inserted to help drain your urine.  The surgeon will make a cut (incision) through the skin in your lower belly. The incision may go side-to-side or up-and-down.  The surgeon will move aside the body tissue that covers your uterus. The surgeon will then carefully take out your uterus along with any of the other organs that need to be removed.  Bleeding will be controlled with clamps or sutures.  The surgeon will close your incision with stitches (sutures), skin glue, or adhesive strips.  A bandage (dressing) will be placed over the incision. The procedure may vary among health care providers and hospitals. What happens after the procedure?  You will be given pain medicine as needed.  Your blood pressure, heart rate, breathing rate, and blood oxygen level will be monitored until the medicines you were given have worn off.  You will need to stay in the hospital to recover for one to two days. Ask your health care provider how long you will need to stay in the hospital after your procedure.  You may have a liquid diet at first. You will most likely return to your usual diet the day after surgery.  You will still have the urinary catheter in place. It will likely be removed the day after surgery.  You may have to wear compression stockings. These stockings help to prevent blood clots and reduce swelling in your legs.  You will be encouraged to walk as soon as possible. You will also use a device or do breathing exercises to keep your lungs clear.  You may need to use a sanitary napkin for vaginal discharge. Summary  Abdominal hysterectomy is a surgical  procedure to remove the womb (uterus). The uterus is the muscular organ that houses a developing baby.  This procedure can affect the way you feel about yourself. Talk to your health care provider about the physical and emotional changes this procedure may cause.  You will be given medicines for pain after the procedure.  You will need to stay in the hospital to recover. Ask your health care provider how long you will need to stay in the hospital after your procedure. This information is not intended to replace advice given to you by your health care provider. Make sure you discuss any questions you have with your health care provider. Document Released: 05/21/2013 Document Revised: 05/04/2016 Document Reviewed: 05/04/2016 Elsevier Interactive Patient Education  2017 [place]. Menorrhagia Menorrhagia is a condition in which menstrual periods are heavy or last longer than normal. With menorrhagia, most periods a woman has may cause enough blood loss and cramping that she becomes unable to take part in her usual activities. What are the causes? Common causes of this condition include:  Noncancerous growths in the  uterus (polyps or fibroids).  An imbalance of the estrogen and progesterone hormones.  One of the ovaries not releasing an egg during one or more months.  A problem with the thyroid gland (hypothyroid).  Side effects of having an intrauterine device (IUD).  Side effects of some medicines, such as anti-inflammatory medicines or blood thinners.  A bleeding disorder that stops the blood from clotting normally.  In some cases, the cause of this condition is not known. What are the signs or symptoms? Symptoms of this condition include:  Routinely having to change your pad or tampon every 1-2 hours because it is completely soaked.  Needing to use pads and tampons at the same time because of heavy bleeding.  Needing to wake up to change your pads or tampons during the  night.  Passing blood clots larger than 1 inch (2.5 cm) in size.  Having bleeding that lasts for more than 7 days.  Having symptoms of low iron levels (anemia), such as tiredness, fatigue, or shortness of breath.  How is this diagnosed? This condition may be diagnosed based on:  A physical exam.  Your symptoms and menstrual history.  Tests, such as: ? Blood tests to check if you are pregnant or have hormonal changes, a bleeding or thyroid disorder, anemia, or other problems. ? Pap test to check for cancerous changes, infections, or inflammation. ? Endometrial biopsy. This test involves removing a tissue sample from the lining of the uterus (endometrium) to be examined under a microscope. ? Pelvic ultrasound. This test uses sound waves to create images of your uterus, ovaries, and vagina. The images can show if you have fibroids or other growths. ? Hysteroscopy. For this test, a small telescope is used to look inside your uterus.  How is this treated? Treatment may not be needed for this condition. If it is needed, the best treatment for you will depend on:  Whether you need to prevent pregnancy.  Your desire to have children in the future.  The cause and severity of your bleeding.  Your personal preference.  Medicines are the first step in treatment. You may be treated with:  Hormonal birth control methods. These treatments reduce bleeding during your menstrual period. They include: ? Birth control pills. ? Skin patch. ? Vaginal ring. ? Shots (injections) that you get every 3 months. ? Hormonal IUD (intrauterine device). ? Implants that go under the skin.  Medicines that thicken blood and slow bleeding.  Medicines that reduce swelling, such as ibuprofen.  Medicines that contain an artificial (synthetic) hormone called progestin.  Medicines that make the ovaries stop working for a short time.  Iron supplements to treat anemia.  If medicines do not work, surgery may  be done. Surgical options may include:  Dilation and curettage (D&C). In this procedure, your health care provider opens (dilates) your cervix and then scrapes or suctions tissue from the endometrium to reduce menstrual bleeding.  Operative hysteroscopy. In this procedure, a small tube with a light on the end (hysteroscope) is used to view your uterus and help remove polyps that may be causing heavy periods.  Endometrial ablation. This is when various techniques are used to permanently destroy your entire endometrium. After endometrial ablation, most women have little or no menstrual flow. This procedure reduces your ability to become pregnant.  Endometrial resection. In this procedure, an electrosurgical wire loop is used to remove the endometrium. This procedure reduces your ability to become pregnant.  Hysterectomy. This is surgical removal of the  uterus. This is a permanent procedure that stops menstrual periods. Pregnancy is not possible after a hysterectomy.  Follow these instructions at home: Medicines  Take over-the-counter and prescription medicines exactly as told by your health care provider. This includes iron pills.  Do not change or switch medicines without asking your health care provider.  Do not take aspirin or medicines that contain aspirin 1 week before or during your menstrual period. Aspirin may make bleeding worse. General instructions  If you need to change your sanitary pad or tampon more than once every 2 hours, limit your activity until the bleeding stops.  Iron pills can cause constipation. To prevent or treat constipation while you are taking prescription iron supplements, your health care provider may recommend that you: ? Drink enough fluid to keep your urine clear or pale yellow. ? Take over-the-counter or prescription medicines. ? Eat foods that are high in fiber, such as fresh fruits and vegetables, whole grains, and beans. ? Limit foods that are high in  fat and processed sugars, such as fried and sweet foods.  Eat well-balanced meals, including foods that are high in iron. Foods that have a lot of iron include leafy green vegetables, meat, liver, eggs, and whole grain breads and cereals.  Do not try to lose weight until the abnormal bleeding has stopped and your blood iron level is back to normal. If you need to lose weight, work with your health care provider to lose weight safely.  Keep all follow-up visits as told by your health care provider. This is important. Contact a health care provider if:  You soak through a pad or tampon every 1 or 2 hours, and this happens every time you have a period.  You need to use pads and tampons at the same time because you are bleeding so much.  You have nausea, vomiting, diarrhea, or other problems related to medicines you are taking. Get help right away if:  You soak through more than a pad or tampon in 1 hour.  You pass clots bigger than 1 inch (2.5 cm) wide.  You feel short of breath.  You feel like your heart is beating too fast.  You feel dizzy or faint.  You feel very weak or tired. Summary  Menorrhagia is a condition in which menstrual periods are heavy or last longer than normal.  Treatment will depend on the cause of the condition and may include medicines or procedures.  Take over-the-counter and prescription medicines exactly as told by your health care provider. This includes iron pills.  Get help right away if you have heavy bleeding that soaks through more than a pad or tampon in 1 hour, you are passing large clots, or you feel dizzy, faint or short of breath. This information is not intended to replace advice given to you by your health care provider. Make sure you discuss any questions you have with your health care provider. Document Released: 05/16/2005 Document Revised: 05/09/2016 Document Reviewed: 05/09/2016 Elsevier Interactive Patient Education  2018 [employer]. Uterine Fibroids Uterine fibroids are tissue masses (tumors) that can develop in the womb (uterus). They are also called leiomyomas. This type of tumor is not cancerous (benign) and does not spread to other parts of the body outside of the pelvic area, which is between the hip bones. Occasionally, fibroids may develop in the fallopian tubes, in the cervix, or on the support structures (ligaments) that surround the uterus. You can have one or many fibroids. Fibroids can  vary in size, weight, and where they grow in the uterus. Some can become quite large. Most fibroids do not require medical treatment. What are the causes? A fibroid can develop when a single uterine cell keeps growing (replicating). Most cells in the human body have a control mechanism that keeps them from replicating without control. What are the signs or symptoms? Symptoms may include:  Heavy bleeding during your period.  Bleeding or spotting between periods.  Pelvic pain and pressure.  Bladder problems, such as needing to urinate more often (urinary frequency) or urgently.  Inability to reproduce offspring (infertility).  Miscarriages.  How is this diagnosed? Uterine fibroids are diagnosed through a physical exam. Your health care provider may feel the lumpy tumors during a pelvic exam. Ultrasonography and an MRI may be done to determine the size, location, and number of fibroids. How is this treated? Treatment may include:  Watchful waiting. This involves getting the fibroid checked by your health care provider to see if it grows or shrinks. Follow your health care provider's recommendations for how often to have this checked.  Hormone medicines. These can be taken by mouth or given through an intrauterine device (IUD).  Surgery. ? Removing the fibroids (myomectomy) or the uterus (hysterectomy). ? Removing blood supply to the fibroids (uterine artery embolization).  If fibroids interfere with your fertility  and you want to become pregnant, your health care provider may recommend having the fibroids removed. Follow these instructions at home:  Keep all follow-up visits as directed by your health care provider. This is important.  Take over-the-counter and prescription medicines only as told by your health care provider. ? If you were prescribed a hormone treatment, take the hormone medicines exactly as directed.  Ask your health care provider about taking iron pills and increasing the amount of dark green, leafy vegetables in your diet. These actions can help to boost your blood iron levels, which may be affected by heavy menstrual bleeding.  Pay close attention to your period and tell your health care provider about any changes, such as: ? Increased blood flow that requires you to use more pads or tampons than usual per month. ? A change in the number of days that your period lasts per month. ? A change in symptoms that are associated with your period, such as abdominal cramping or back pain. Contact a health care provider if:  You have pelvic pain, back pain, or abdominal cramps that cannot be controlled with medicines.  You have an increase in bleeding between and during periods.  You soak tampons or pads in a half hour or less.  You feel lightheaded, extra tired, or weak. Get help right away if:  You faint.  You have a sudden increase in pelvic pain. This information is not intended to replace advice given to you by your health care provider. Make sure you discuss any questions you have with your health care provider. Document Released: 05/13/2000 Document Revised: 01/14/2016 Document Reviewed: 11/12/2013 Elsevier Interactive Patient Education  [employer].

## 2018-02-22 ENCOUNTER — Telehealth: Payer: Self-pay | Admitting: Adult Health

## 2018-02-22 LAB — CBC
HEMATOCRIT: 35.3 % (ref 34.0–46.6)
HEMOGLOBIN: 11 g/dL — AB (ref 11.1–15.9)
MCH: 22.1 pg — AB (ref 26.6–33.0)
MCHC: 31.2 g/dL — AB (ref 31.5–35.7)
MCV: 71 fL — AB (ref 79–97)
Platelets: 425 10*3/uL (ref 150–450)
RBC: 4.97 x10E6/uL (ref 3.77–5.28)
RDW: 17 % — ABNORMAL HIGH (ref 12.3–15.4)
WBC: 9.5 10*3/uL (ref 3.4–10.8)

## 2018-02-22 LAB — COMPREHENSIVE METABOLIC PANEL
ALBUMIN: 4.4 g/dL (ref 3.5–5.5)
ALT: 18 IU/L (ref 0–32)
AST: 16 IU/L (ref 0–40)
Albumin/Globulin Ratio: 1.6 (ref 1.2–2.2)
Alkaline Phosphatase: 66 IU/L (ref 39–117)
BILIRUBIN TOTAL: 0.2 mg/dL (ref 0.0–1.2)
BUN/Creatinine Ratio: 12 (ref 9–23)
BUN: 9 mg/dL (ref 6–24)
CALCIUM: 9.7 mg/dL (ref 8.7–10.2)
CHLORIDE: 102 mmol/L (ref 96–106)
CO2: 23 mmol/L (ref 20–29)
Creatinine, Ser: 0.73 mg/dL (ref 0.57–1.00)
GFR, EST AFRICAN AMERICAN: 117 mL/min/{1.73_m2} (ref >59)
GFR, EST NON AFRICAN AMERICAN: 101 mL/min/{1.73_m2} (ref >59)
Globulin, Total: 2.7 g/dL (ref 1.5–4.5)
Glucose: 94 mg/dL (ref 65–99)
POTASSIUM: 4.1 mmol/L (ref 3.5–5.2)
Sodium: 140 mmol/L (ref 134–144)
TOTAL PROTEIN: 7.1 g/dL (ref 6.0–8.5)

## 2018-02-22 LAB — CYTOLOGY - PAP
Chlamydia: NEGATIVE
Diagnosis: NEGATIVE
HPV: NOT DETECTED
Neisseria Gonorrhea: NEGATIVE

## 2018-02-22 LAB — TSH: TSH: 2.29 u[IU]/mL (ref 0.450–4.500)

## 2018-02-22 NOTE — Telephone Encounter (Signed)
Left message that I called, if she calls, needs to take MV with iron

## 2018-02-22 NOTE — Telephone Encounter (Signed)
Pt aware hgb 11 take MV with iron

## 2018-02-28 ENCOUNTER — Other Ambulatory Visit: Payer: Medicare Other

## 2018-02-28 ENCOUNTER — Encounter: Payer: Self-pay | Admitting: *Deleted

## 2018-06-01 ENCOUNTER — Other Ambulatory Visit: Payer: Self-pay | Admitting: Adult Health

## 2018-06-18 ENCOUNTER — Other Ambulatory Visit: Payer: Self-pay | Admitting: Adult Health

## 2018-08-24 ENCOUNTER — Other Ambulatory Visit: Payer: Self-pay | Admitting: Adult Health

## 2018-09-14 ENCOUNTER — Other Ambulatory Visit: Payer: Self-pay | Admitting: Adult Health

## 2018-10-16 ENCOUNTER — Telehealth: Payer: Self-pay | Admitting: Adult Health

## 2018-10-16 NOTE — Telephone Encounter (Signed)
Patient called requesting an appointment, stated her belly is swelling.  She's not sure if it's her fibroids or not.  Please advise.  [phone]

## 2018-10-17 NOTE — Telephone Encounter (Signed)
Pt says stomach looks bigger, has not had period in several months has fibroids, to make appt for tomorrow at 2:30

## 2018-10-18 ENCOUNTER — Encounter: Payer: Self-pay | Admitting: Adult Health

## 2018-10-18 ENCOUNTER — Other Ambulatory Visit: Payer: Self-pay | Admitting: Adult Health

## 2018-10-18 ENCOUNTER — Other Ambulatory Visit: Payer: Self-pay

## 2018-10-18 ENCOUNTER — Ambulatory Visit (INDEPENDENT_AMBULATORY_CARE_PROVIDER_SITE_OTHER): Payer: Medicare Other | Admitting: Adult Health

## 2018-10-18 ENCOUNTER — Other Ambulatory Visit (INDEPENDENT_AMBULATORY_CARE_PROVIDER_SITE_OTHER): Payer: Medicare Other

## 2018-10-18 VITALS — BP 131/80 | HR 90 | Temp 98.4°F | Ht 63.0 in | Wt 232.5 lb

## 2018-10-18 DIAGNOSIS — Z3201 Encounter for pregnancy test, result positive: Secondary | ICD-10-CM

## 2018-10-18 DIAGNOSIS — Z3A15 15 weeks gestation of pregnancy: Secondary | ICD-10-CM | POA: Diagnosis not present

## 2018-10-18 DIAGNOSIS — O3680X0 Pregnancy with inconclusive fetal viability, not applicable or unspecified: Secondary | ICD-10-CM

## 2018-10-18 DIAGNOSIS — Z362 Encounter for other antenatal screening follow-up: Secondary | ICD-10-CM | POA: Diagnosis not present

## 2018-10-18 DIAGNOSIS — Z3490 Encounter for supervision of normal pregnancy, unspecified, unspecified trimester: Secondary | ICD-10-CM | POA: Insufficient documentation

## 2018-10-18 DIAGNOSIS — D219 Benign neoplasm of connective and other soft tissue, unspecified: Secondary | ICD-10-CM | POA: Diagnosis not present

## 2018-10-18 LAB — POCT URINE PREGNANCY: Preg Test, Ur: POSITIVE — AB

## 2018-10-18 NOTE — Progress Notes (Signed)
[place] 15+6 wks IUP,fhr 152 bpm,posterior placenta,ovaries not visualized,multiple large fibroids (#1) anterior 8.3x 7.8 x 6 cm,(#2) anterior fundal 10.3 x 8.8 x 11.2 cm,(#3) fundal 7.2 x 6.7 x 7.8 cm,pedunculated fundal fibroid 21 x 19.6 x 17 cm

## 2018-10-18 NOTE — Progress Notes (Signed)
Patient ID: Kimberly Black, female   DOB: 06-28-1975, 43 y.o.   MRN: [id number] History of Present Illness: Kimberly Black is a 43 year old black female, in complaining of stomach getting bigger, wonders if fibroids are growing.    Current Medications, Allergies, Past Medical History, Past Surgical History, Family History and Social History were reviewed in [employer] record.     Review of Systems: Feels like stomach is getting bigger Has known fibroids     Physical Exam:BP 131/80 (BP Location: Right Arm, Patient Position: Sitting, Cuff Size: Large)   Pulse 90   Temp 98.4 F (36.9 C)   Ht 5\' 3"  (1.6 m)   Wt 232 lb 8 oz (105.5 kg)   LMP  (LMP Unknown) Comment: last period in Feb.   BMI 41.19 kg/m   UPT +. General:  Well developed, well nourished, no acute distress Skin:  Warm and dry Lungs; Clear to auscultation bilaterally Cardiovascular: Regular rate and rhythm Abdomen:  Soft, non tender, no hepatosplenomegaly Psych:  No mood changes, alert and cooperative, not happy about +UPT, does not want a baby. POC [place] looks to be between 14-16 weeks +FHM and +FM.multiple fibroids.  Fall risk is low.  Will get stat US.   Impression: 1. Fibroids   2. Pregnancy, unspecified gestational age   43. Pregnancy examination or test, positive result       Plan: Will get [place] today, will next when results back

## 2018-10-23 ENCOUNTER — Telehealth: Payer: Self-pay | Admitting: Adult Health

## 2018-10-23 NOTE — Telephone Encounter (Signed)
Pt aware [place] sows large fibroids and that now 16+4 weeks with EDD 04/05/19, she still wants termination and and offered [facility] [phone] but she is checking out clinic in [place] and will let me know.

## 2018-11-03 ENCOUNTER — Encounter (HOSPITAL_COMMUNITY): Payer: Self-pay

## 2018-11-03 ENCOUNTER — Other Ambulatory Visit: Payer: Self-pay

## 2018-11-03 ENCOUNTER — Observation Stay (HOSPITAL_COMMUNITY): Payer: Medicare Other

## 2018-11-03 ENCOUNTER — Inpatient Hospital Stay (HOSPITAL_COMMUNITY)
Admission: AD | Admit: 2018-11-03 | Discharge: 2018-11-06 | DRG: 770 | Disposition: A | Discharge status: Home / Self Care | Payer: Medicare Other | Attending: Obstetrics and Gynecology | Admitting: Obstetrics and Gynecology

## 2018-11-03 ENCOUNTER — Encounter (HOSPITAL_COMMUNITY): Payer: Self-pay | Admitting: *Deleted

## 2018-11-03 DIAGNOSIS — D259 Leiomyoma of uterus, unspecified: Secondary | ICD-10-CM | POA: Diagnosis not present

## 2018-11-03 DIAGNOSIS — O3412 Maternal care for benign tumor of corpus uteri, second trimester: Secondary | ICD-10-CM

## 2018-11-03 DIAGNOSIS — Z3A17 17 weeks gestation of pregnancy: Secondary | ICD-10-CM

## 2018-11-03 DIAGNOSIS — D219 Benign neoplasm of connective and other soft tissue, unspecified: Secondary | ICD-10-CM | POA: Diagnosis present

## 2018-11-03 DIAGNOSIS — O0739 Failed attempted termination of pregnancy with other complications: Principal | ICD-10-CM | POA: Diagnosis present

## 2018-11-03 DIAGNOSIS — O0330 Unspecified complication following incomplete spontaneous abortion: Secondary | ICD-10-CM | POA: Diagnosis present

## 2018-11-03 DIAGNOSIS — O99012 Anemia complicating pregnancy, second trimester: Secondary | ICD-10-CM | POA: Diagnosis present

## 2018-11-03 DIAGNOSIS — Z1159 Encounter for screening for other viral diseases: Secondary | ICD-10-CM

## 2018-11-03 DIAGNOSIS — Z3689 Encounter for other specified antenatal screening: Secondary | ICD-10-CM

## 2018-11-03 DIAGNOSIS — O034 Incomplete spontaneous abortion without complication: Secondary | ICD-10-CM | POA: Diagnosis present

## 2018-11-03 DIAGNOSIS — Z3A18 18 weeks gestation of pregnancy: Secondary | ICD-10-CM

## 2018-11-03 DIAGNOSIS — D649 Anemia, unspecified: Secondary | ICD-10-CM | POA: Diagnosis present

## 2018-11-03 LAB — CBC WITH DIFFERENTIAL/PLATELET
Abs Immature Granulocytes: 0.16 10*3/uL — ABNORMAL HIGH (ref 0.00–0.07)
Basophils Absolute: 0 10*3/uL (ref 0.0–0.1)
Basophils Relative: 0 %
Eosinophils Absolute: 0 10*3/uL (ref 0.0–0.5)
Eosinophils Relative: 0 %
HCT: 32.7 % — ABNORMAL LOW (ref 36.0–46.0)
Hemoglobin: 10.3 g/dL — ABNORMAL LOW (ref 12.0–15.0)
Immature Granulocytes: 1 %
Lymphocytes Relative: 10 %
Lymphs Abs: 1.4 10*3/uL (ref 0.7–4.0)
MCH: 22.5 pg — ABNORMAL LOW (ref 26.0–34.0)
MCHC: 31.5 g/dL (ref 30.0–36.0)
MCV: 71.6 fL — ABNORMAL LOW (ref 80.0–100.0)
Monocytes Absolute: 1 10*3/uL (ref 0.1–1.0)
Monocytes Relative: 7 %
Neutro Abs: 11.7 10*3/uL — ABNORMAL HIGH (ref 1.7–7.7)
Neutrophils Relative %: 82 %
Platelets: 355 10*3/uL (ref 150–400)
RBC: 4.57 MIL/uL (ref 3.87–5.11)
RDW: 17.2 % — ABNORMAL HIGH (ref 11.5–15.5)
WBC: 14.3 10*3/uL — ABNORMAL HIGH (ref 4.0–10.5)
nRBC: 0.2 % (ref 0.0–0.2)

## 2018-11-03 LAB — ABO/RH: ABO/RH(D): O POS

## 2018-11-03 LAB — TYPE AND SCREEN
ABO/RH(D): O POS
Antibody Screen: NEGATIVE

## 2018-11-03 LAB — SARS CORONAVIRUS 2 BY RT PCR (HOSPITAL ORDER, PERFORMED IN ~~LOC~~ HOSPITAL LAB): SARS Coronavirus 2: NEGATIVE

## 2018-11-03 MED ORDER — ACETAMINOPHEN 325 MG PO TABS
650.0000 mg | ORAL_TABLET | ORAL | Status: DC | PRN
  Administered 2018-11-04: 650 mg via ORAL
  Filled 2018-11-03: qty 2

## 2018-11-03 MED ORDER — MISOPROSTOL 200 MCG PO TABS
600.0000 ug | ORAL_TABLET | ORAL | Status: DC
  Administered 2018-11-03 – 2018-11-04 (×2): 600 ug via VAGINAL
  Filled 2018-11-03 (×3): qty 3

## 2018-11-03 MED ORDER — ZOLPIDEM TARTRATE 5 MG PO TABS: 5.0000 mg | ORAL_TABLET | Freq: Every evening | ORAL | Status: DC | PRN

## 2018-11-03 MED ORDER — DOCUSATE SODIUM 100 MG PO CAPS: 100.0000 mg | ORAL_CAPSULE | Freq: Every day | ORAL | Status: DC

## 2018-11-03 MED ORDER — SODIUM CHLORIDE 0.9 % IV SOLN
1.5000 g | Freq: Once | INTRAVENOUS | Status: AC
  Administered 2018-11-03: 1.5 g via INTRAVENOUS
  Filled 2018-11-03: qty 1.5

## 2018-11-03 MED ORDER — LACTATED RINGERS IV SOLN
INTRAVENOUS | Status: DC
  Administered 2018-11-03 – 2018-11-05 (×4): via INTRAVENOUS

## 2018-11-03 MED ORDER — SODIUM CHLORIDE 0.9 % IV SOLN
1.5000 g | Freq: Four times a day (QID) | INTRAVENOUS | Status: AC
  Administered 2018-11-04 – 2018-11-06 (×10): 1.5 g via INTRAVENOUS
  Filled 2018-11-03 (×15): qty 1.5

## 2018-11-03 MED ORDER — CALCIUM CARBONATE ANTACID 500 MG PO CHEW: 2.0000 | CHEWABLE_TABLET | ORAL | Status: DC | PRN

## 2018-11-03 MED ORDER — FENTANYL CITRATE (PF) 100 MCG/2ML IJ SOLN
50.0000 ug | INTRAMUSCULAR | Status: DC | PRN
  Administered 2018-11-05: 100 ug via INTRAVENOUS
  Filled 2018-11-03 (×2): qty 2

## 2018-11-03 NOTE — Progress Notes (Signed)
OB/[facility]N [facility]: Labor Progress Note  Subjective: Into room to introduce self to patient.   Objective: BP 134/73   Pulse 85   Temp 98.7 F (37.1 C) (Oral)   LMP  (LMP Unknown) Comment: last period in Feb.   SpO2 100%  Gen: well-appearing, NAD    Assessment and Plan: 43 y.o. [id number] [redacted]w[redacted]d here after failed therapeutic abortion at outside clinic earlier today.   Labor: Failed TAB by D&E today in outside clinic, see Dr. Marjory Lies H&P and MAU provider note for additional details. Per notes, seems that failure related to large uterine fibroids obstructing LUS. Per report, one fetal limb removed but procedure not able to be completed. Per report, had been dilated to 3cm but on arrival here was FT on Dr. Marjory Lies exam. Discussed process for medical management moving forward including risks/benefits and patient in agreement. Vaginal cytotec ordered. No active bleeding on admission, HgB stable at 10.3. -- Unasyn every 6hrs given procedure, elevated temp on presentation and WBC 14.3 -- pain control: IV fentanyl, may have epidural  -- PPH Risk: medium   Fetal Status: Breech per initial report today, no cardiac activity identified.   Lambert Mody. Juleen China, DO OB/GYN Fellow, Faculty Practice  10:17 PM

## 2018-11-03 NOTE — MAU Note (Signed)
Patient sent over from [facility].  Stated they sent her over because they could not complete her abortion due to the presence of fibroids. Denies any pain at the time.

## 2018-11-03 NOTE — H&P (Signed)
Kimberly Black is an 43 y.o. female G4P1021 IUP 17 6/7 weeks by 16 week U/S. Presents via EMS from [facility] due to inability to complete second trimester D & E. See Dr Chauncey Cruel. Kimberly Black's notes from [facility] for additional information.  Pt reports she thought she could not become pregnant. Was seen at [facility] last month with confirmation of pregnancy and U/S demonstrating multiple uterine fibroids and IUP  16 weeks.  Pt desired termination and presented to [facility] today for termination.   TSVD 23 yrs ago First EAB x 2   Menstrual History: Menarche age: 3 No LMP recorded (lmp unknown). Patient is pregnant.    Past Medical History:  Diagnosis Date  . Acne   . Anemia 11/25/2013  . BV (bacterial vaginosis)   . Fibroids    uterine  . History of trichomoniasis   . Menorrhagia 11/08/2013  . Obesity   . Vaginal discharge 01/07/2015  . Vaginal odor 11/08/2013    Past Surgical History:  Procedure Laterality Date  . NO PAST SURGERIES      Family History  Problem Relation Age of Onset  . Hypertension Mother   . Diabetes Father   . Cancer Maternal Grandfather   . Asthma Son   . Cancer Maternal Aunt     Social History:  reports that she has never smoked. She has never used smokeless tobacco. She reports that she does not drink alcohol or use drugs.  Allergies: No Known Allergies  No medications prior to admission.    Review of Systems  Constitutional: Negative.   Respiratory: Negative.   Cardiovascular: Negative.   Gastrointestinal: Negative.     Blood pressure 134/73, pulse 85, temperature 99 F (37.2 C), temperature source Oral, SpO2 100 %. Bedside U/S FHT's 90's Physical Exam  Constitutional: She appears well-developed and well-nourished.  Cardiovascular: Normal rate and regular rhythm.  Respiratory: Effort normal and breath sounds normal.  GI: Soft. Bowel sounds are normal.  Abd/pelvic mass affect, gravid uterus, FH 32 Non tender  Genitourinary:     Genitourinary Comments: Nl EGBUS, Blood noted in the vaginal vault  Cervix 1 cm, no fetal parts noted  Enlarged irregular shaped uterus, non tender      Results for orders placed or performed during the hospital encounter of 11/03/18 (from the past 24 hour(s))  CBC with Differential/Platelet     Status: Abnormal   Collection Time: 11/03/18  4:56 PM  Result Value Ref Range   WBC 14.3 (H) 4.0 - 10.5 K/uL   RBC 4.57 3.87 - 5.11 MIL/uL   Hemoglobin 10.3 (L) 12.0 - 15.0 g/dL   HCT 32.7 (L) 36.0 - 46.0 %   MCV 71.6 (L) 80.0 - 100.0 fL   MCH 22.5 (L) 26.0 - 34.0 pg   MCHC 31.5 30.0 - 36.0 g/dL   RDW 17.2 (H) 11.5 - 15.5 %   Platelets 355 150 - 400 K/uL   nRBC 0.2 0.0 - 0.2 %   Neutrophils Relative % 82 %   Neutro Abs 11.7 (H) 1.7 - 7.7 K/uL   Lymphocytes Relative 10 %   Lymphs Abs 1.4 0.7 - 4.0 K/uL   Monocytes Relative 7 %   Monocytes Absolute 1.0 0.1 - 1.0 K/uL   Eosinophils Relative 0 %   Eosinophils Absolute 0.0 0.0 - 0.5 K/uL   Basophils Relative 0 %   Basophils Absolute 0.0 0.0 - 0.1 K/uL   Immature Granulocytes 1 %   Abs Immature Granulocytes 0.16 (H)  0.00 - 0.07 K/uL  Type and screen     Status: None   Collection Time: 11/03/18  4:58 PM  Result Value Ref Range   ABO/RH(D) O POS    Antibody Screen NEG    Sample Expiration      11/06/2018,2359 Performed at [facility], [place] [address], [place], [place] [address]   ABO/Rh     Status: None (Preliminary result)   Collection Time: 11/03/18  4:58 PM  Result Value Ref Range   ABO/RH(D)      O POS Performed at [facility] [address], [place], [place] [address]    Spoke with Dr Hennie Duos, who reports was able to dilate pt and began the D & E procedure. Was able to remove a lower ext but was unable to complete the procedure due to uterine fibroid in the lower uterine segment.  She then terminated the procedure.  No results found.  Assessment/Plan: Failed second trimester D & E. Uterine  fibroids Chronic anemia  Discussed Dx with pt. Will admit for Cytotec IOL and antibiotic therapy. Obtain limited OB U/S. Cytotec IOL reviewed with pt including possibility of need for D & E. Pt verbalized understanding of POC and agrees to proceed.   Kimberly Black 11/03/2018, 6:03 PM

## 2018-11-03 NOTE — MAU Provider Note (Signed)
Chief Complaint: [facility]   First Provider Initiated Contact with Patient 11/03/18 1625     SUBJECTIVE HPI: Kimberly Black is a 43 y.o. O6V6720 at [redacted]w[redacted]d who presents to M[facility] Admissions via EMS reporting complications from an abortion. [facility] was at [facility] today for an abortion. She is 18-[redacted] wks pregnant based on a recent ultrasound. Has multiple large uterine fibroids. Attempted procedure today included AROM & cervical dilation but had to be stopped d/t fibroid in the LUS preventing the doctor from completing the procedure.  Currently denies pain.    Past Medical History:  Diagnosis Date  . Acne   . Anemia 11/25/2013  . BV (bacterial vaginosis)   . Fibroids    uterine  . History of trichomoniasis   . Menorrhagia 11/08/2013  . Obesity   . Vaginal discharge 01/07/2015  . Vaginal odor 11/08/2013   OB History  Gravida Para Term Preterm AB Living  4 1 1   2 1   SAB TAB Ectopic Multiple Live Births    2     1    # Outcome Date GA Lbr Len/2nd Weight Sex Delivery Anes PTL Lv  4 Current           3 TAB 2001          2 Term 10/05/95 [redacted]w[redacted]d  3742 g M Vag-Spont   LIV  1 TAB            Past Surgical History:  Procedure Laterality Date  . NO PAST SURGERIES     Social History   Socioeconomic History  . Marital status: Single    Spouse name: Not on file  . Number of children: Not on file  . Years of education: Not on file  . Highest education level: Not on file  Occupational History  . Not on file  Social Needs  . Financial resource strain: Not on file  . Food insecurity:    Worry: Not on file    Inability: Not on file  . Transportation needs:    Medical: Not on file    Non-medical: Not on file  Tobacco Use  . Smoking status: Never Smoker  . Smokeless tobacco: Never Used  Substance and Sexual Activity  . Alcohol use: No  . Drug use: No  . Sexual activity: Yes    Birth control/protection: None  Lifestyle  . Physical activity:    Days  per week: Not on file    Minutes per session: Not on file  . Stress: Not on file  Relationships  . Social connections:    Talks on phone: Not on file    Gets together: Not on file    Attends religious service: Not on file    Active member of club or organization: Not on file    Attends meetings of clubs or organizations: Not on file    Relationship status: Not on file  . Intimate partner violence:    Fear of current or ex partner: Not on file    Emotionally abused: Not on file    Physically abused: Not on file    Forced sexual activity: Not on file  Other Topics Concern  . Not on file  Social History Narrative  . Not on file   Family History  Problem Relation Age of Onset  . Hypertension Mother   . Diabetes Father   . Cancer Maternal Grandfather   . Asthma Son   . Cancer Maternal Aunt  No current facility-administered medications on file prior to encounter.    No current outpatient medications on file prior to encounter.   No Known Allergies  I have reviewed patient's Past Medical Hx, Surgical Hx, Family Hx, Social Hx, medications and allergies.   Review of Systems  Constitutional: Negative.   Gastrointestinal: Negative.     OBJECTIVE Patient Vitals for the past 24 hrs:  BP Temp Temp src Pulse SpO2  11/03/18 1712 - 99 F (37.2 C) Oral - -  11/03/18 1634 134/73 - - 85 -  11/03/18 1609 (!) 150/83 99.9 F (37.7 C) Oral 90 100 %   Constitutional: Well-developed, well-nourished female in no acute distress.  Cardiovascular: normal rate & rhythm, no murmur Respiratory: normal rate and effort. Lung sounds clear throughout GI: Enlarged abdomen d/t fibroid uterus Abd soft, non-tender, Pos BS x 4. No guarding or rebound tenderness MS: Extremities nontender, no edema, normal ROM Neurologic: Alert and oriented x 4.     Pt informed that the ultrasound is considered a limited OB ultrasound and is not intended to be a complete ultrasound exam.  Patient also informed that  the ultrasound is not being completed with the intent of assessing for fetal or placental anomalies or any pelvic abnormalities.  Explained that the purpose of today's ultrasound is to assess for  viability.  Patient acknowledges the purpose of the exam and the limitations of the study.  FHR present in the 90s    LAB RESULTS Results for orders placed or performed during the hospital encounter of 11/03/18 (from the past 24 hour(s))  CBC with Differential/Platelet     Status: Abnormal   Collection Time: 11/03/18  4:56 PM  Result Value Ref Range   WBC 14.3 (H) 4.0 - 10.5 K/uL   RBC 4.57 3.87 - 5.11 MIL/uL   Hemoglobin 10.3 (L) 12.0 - 15.0 g/dL   HCT 32.7 (L) 36.0 - 46.0 %   MCV 71.6 (L) 80.0 - 100.0 fL   MCH 22.5 (L) 26.0 - 34.0 pg   MCHC 31.5 30.0 - 36.0 g/dL   RDW 17.2 (H) 11.5 - 15.5 %   Platelets 355 150 - 400 K/uL   nRBC 0.2 0.0 - 0.2 %   Neutrophils Relative % 82 %   Neutro Abs 11.7 (H) 1.7 - 7.7 K/uL   Lymphocytes Relative 10 %   Lymphs Abs 1.4 0.7 - 4.0 K/uL   Monocytes Relative 7 %   Monocytes Absolute 1.0 0.1 - 1.0 K/uL   Eosinophils Relative 0 %   Eosinophils Absolute 0.0 0.0 - 0.5 K/uL   Basophils Relative 0 %   Basophils Absolute 0.0 0.0 - 0.1 K/uL   Immature Granulocytes 1 %   Abs Immature Granulocytes 0.16 (H) 0.00 - 0.07 K/uL    IMAGING No results found.  MAU COURSE Orders Placed This Encounter  Procedures  . CBC with Differential/Platelet  . Type and screen   No orders of the defined types were placed in this encounter.   MDM FHT present via bedside ultrasound Pt with elevated temp. CBC & T&S ordered  Dr. Rip Harbour aware of patient & will come evaluate her  ASSESSMENT Failed 2nd trimester TAB   PLAN Dr. Rip Harbour on unit to evaluate patient & discuss plan of care  Jorje Guild, NP 11/03/2018  5:12 PM

## 2018-11-04 DIAGNOSIS — Z1159 Encounter for screening for other viral diseases: Secondary | ICD-10-CM | POA: Diagnosis not present

## 2018-11-04 DIAGNOSIS — O034 Incomplete spontaneous abortion without complication: Secondary | ICD-10-CM | POA: Diagnosis present

## 2018-11-04 DIAGNOSIS — D649 Anemia, unspecified: Secondary | ICD-10-CM | POA: Diagnosis present

## 2018-11-04 DIAGNOSIS — D259 Leiomyoma of uterus, unspecified: Secondary | ICD-10-CM | POA: Diagnosis present

## 2018-11-04 DIAGNOSIS — O3412 Maternal care for benign tumor of corpus uteri, second trimester: Secondary | ICD-10-CM | POA: Diagnosis present

## 2018-11-04 DIAGNOSIS — O0739 Failed attempted termination of pregnancy with other complications: Secondary | ICD-10-CM | POA: Diagnosis present

## 2018-11-04 DIAGNOSIS — O99012 Anemia complicating pregnancy, second trimester: Secondary | ICD-10-CM | POA: Diagnosis present

## 2018-11-04 DIAGNOSIS — Z3A18 18 weeks gestation of pregnancy: Secondary | ICD-10-CM | POA: Diagnosis not present

## 2018-11-04 DIAGNOSIS — O0330 Unspecified complication following incomplete spontaneous abortion: Secondary | ICD-10-CM | POA: Diagnosis not present

## 2018-11-04 MED ORDER — MISOPROSTOL 200 MCG PO TABS
200.0000 ug | ORAL_TABLET | ORAL | Status: DC | PRN
  Administered 2018-11-04 – 2018-11-05 (×3): 200 ug via VAGINAL
  Filled 2018-11-04 (×3): qty 1

## 2018-11-04 MED ORDER — ACETAMINOPHEN 500 MG PO TABS
1000.0000 mg | ORAL_TABLET | Freq: Once | ORAL | Status: AC
  Administered 2018-11-04: 1000 mg via ORAL

## 2018-11-04 MED ORDER — MISOPROSTOL 200 MCG PO TABS
600.0000 ug | ORAL_TABLET | ORAL | Status: AC
  Administered 2018-11-04 (×3): 600 ug via VAGINAL
  Filled 2018-11-04 (×3): qty 3

## 2018-11-04 NOTE — Progress Notes (Signed)
Kimberly Black is a 43 y.o. L8V5643 at [redacted]w[redacted]d admitted for incomplete D&E at outside clinic.  Subjective: Sitting comfortably in bed, son at bedside. Patient denies pain, questions, concerns at this time.  Objective: BP 129/64   Pulse 79   Temp 98.2 F (36.8 C) (Oral)   Resp 16   Ht 5\' 3"  (1.6 m)   Wt 95.3 kg   LMP  (LMP Unknown) Comment: last period in Feb.   SpO2 100%   BMI 37.20 kg/m  No intake/output data recorded. No intake/output data recorded.  SVE:   Dilation: 1 Effacement (%): Thick Station: -3 Exam by:: SRussell, RN   Labs: Lab Results  Component Value Date   WBC 14.3 (H) 11/03/2018   HGB 10.3 (L) 11/03/2018   HCT 32.7 (L) 11/03/2018   MCV 71.6 (L) 11/03/2018   PLT 355 11/03/2018    Assessment / Plan: At bedside for introduction and to discuss plan of care S/p Cytotec 600 mcg x 3, most recently at 0838 today.  Receiving Unasyn for infection prophylaxis following procedure at outside clinic Afebrile and normotensive today Continue Cytotec q [place], CNM 11/04/2018, 10:22 AM

## 2018-11-04 NOTE — Progress Notes (Signed)
OB/GYN Faculty Practice: Labor Progress Note  Subjective: Into room to check on patient. States feeling some mild cramping but that's all. Was able to eat some food.   Objective: BP (!) 150/62   Pulse 77   Temp 99 F (37.2 C) (Oral)   Resp 17   Ht 5\' 3"  (1.6 m)   Wt 95.3 kg   LMP  (LMP Unknown) Comment: last period in Feb.   SpO2 100%   BMI 37.20 kg/m  Gen: tired-appearing, NAD Dilation: 1 Effacement (%): Thick Cervical Position: Posterior, Middle Station: -3 Presentation: (per ultrasound Breech) Exam by:: Mortimer Fries, RN  Assessment and Plan: 43 y.o. W8G8916 [redacted]w[redacted]d here after failed therapeutic abortion at outside clinic earlier today.   Labor: First dose of vaginal cytotec administered at 2330. Will continue every 4 hours.  -- Unasyn every 6hrs given procedure, elevated temp on presentation and WBC 14.3 -- pain control: IV fentanyl, may have epidural  -- PPH Risk: medium   Fetal Status: Breech per initial report today, no cardiac activity identified.   Elevated Blood Pressures: On arrival and recently. Will continue to monitor closely.   Lambert Mody. Juleen China, DO OB/GYN Fellow, Faculty Practice  2:47 AM

## 2018-11-04 NOTE — Progress Notes (Signed)
OB/GYN Faculty Practice: Labor Progress Note  Subjective: Doing well, has been able to get some rest. Has not felt much recently, occasional cramping sensation.   Objective: BP (!) 103/58   Pulse 84   Temp 98.5 F (36.9 C) (Oral)   Resp 17   Ht 5\' 3"  (1.6 m)   Wt 95.3 kg   LMP  (LMP Unknown) Comment: last period in Feb.   SpO2 100%   BMI 37.20 kg/m  Gen: tired-appearing, NAD Dilation: 1 Effacement (%): Thick Cervical Position: Posterior, Middle Station: -3 Presentation: (per ultrasound Breech) Exam by:: (Bre Price, RN)  Assessment and Plan: 43 y.o. O8C1660 [redacted]w[redacted]d here after failed therapeutic abortion at outside clinic earlier today.   Labor: Has received 2 doses of vaginal cytotec. Did have elevated temp to 102 - likely because of cytotec but also on Unasyn for possible infection. Continue cytotec every 4 hours.  -- Unasyn every 6hrs given procedure, elevated temp on presentation and WBC 14.3 -- pain control: IV fentanyl, may have epidural  -- PPH Risk: medium   Fetal Status: Breech per initial report today, no cardiac activity identified.   Elevated Blood Pressures: On arrival and recently. Will continue to monitor closely.   Lambert Mody. Juleen China, DO OB/GYN Fellow, Faculty Practice  7:00 AM

## 2018-11-04 NOTE — Progress Notes (Signed)
LABOR PROGRESS NOTE  HONI NAME is a 43 y.o. [phone] at [redacted]w[redacted]d  admitted for failed TAB by D&E in outside clinic, now fetal demise and incomplete abortion    Subjective: Patient comfortable, reports occasional cramping   Objective: BP (!) 144/70   Pulse 78   Temp 100.1 F (37.8 C) (Oral)   Resp 16   Ht 5\' 3"  (1.6 m)   Wt 95.3 kg   LMP  (LMP Unknown) Comment: last period in Feb.   SpO2 100%   BMI 37.20 kg/m  or  Vitals:   11/04/18 2104 11/04/18 2108 11/04/18 2146 11/04/18 2233  BP:  (!) 141/72 (!) 144/70   Pulse:  80 78   Resp:  16 16   Temp: (!) 101.6 F (38.7 C)   100.1 F (37.8 C)  TempSrc: Oral   Oral  SpO2:      Weight:      Height:        Multiple large pieces of old cytotec sitting in vaginal canal- removed  Dilation: 1.5 Effacement (%): Thick Cervical Position: Posterior, Middle Station: -3 Presentation: Undeterminable Exam by:: Darrol Poke, CNM  Labs: Lab Results  Component Value Date   WBC 14.3 (H) 11/03/2018   HGB 10.3 (L) 11/03/2018   HCT 32.7 (L) 11/03/2018   MCV 71.6 (L) 11/03/2018   PLT 355 11/03/2018    Patient Active Problem List   Diagnosis Date Noted  . Incomplete abortion 11/04/2018  . Abortion, incomplete, with complications 16/11/3708  . Enlarged uterus 05/02/2017  . Fibroids 11/08/2013    Assessment / Plan: 43 y.o. G4P1021 at [redacted]w[redacted]d here for failed TAB by D&E in outside clinic, now fetal demise and incomplete abortion    Labor: IOL with Cytotec, continue cytotec every 4 hours, currently on cytotec dose #6  Pain Control:  Pain medication ordered PRN  -- Elevated temp, Tylenol given, most likely because of cytotec but will continue antibiotics for possible infection  -- Elevated BP: on arrival, early during admission and recently, will continue to monitor   Lajean Manes, CNM 11/04/2018, 10:33 PM

## 2018-11-04 NOTE — Progress Notes (Signed)
Kimberly Black is a 43 y.o. G4P1021 at [redacted]w[redacted]d   Subjective: Patient sitting comfortably in bed watching television. Denies pain, questions or concerns at this time.   Objective: BP 130/67   Pulse 79   Temp 99 F (37.2 C) (Oral)   Resp 16   Ht 5\' 3"  (1.6 m)   Wt 95.3 kg   LMP  (LMP Unknown) Comment: last period in Feb.   SpO2 100%   BMI 37.20 kg/m  No intake/output data recorded. Total I/O In: 3354 [I.V.:1202; IV Piggyback:200] Out: -    Labs: Lab Results  Component Value Date   WBC 14.3 (H) 11/03/2018   HGB 10.3 (L) 11/03/2018   HCT 32.7 (L) 11/03/2018   MCV 71.6 (L) 11/03/2018   PLT 355 11/03/2018    Assessment / Plan: S/p Cytotec x 4 Cervix now 1.5 and long, very posterior Per Dr. Roselie Awkward, no monitoring required Continue Cytotec q 4, Unasyn q [place], CNM 11/04/2018, 12:47 PM

## 2018-11-04 NOTE — Progress Notes (Signed)
Kimberly Black is a 43 y.o. [phone] at [redacted]w[redacted]d   Subjective: Continues to deny pain, reporting recurrent mild cramping  Objective: BP (!) 138/57   Pulse 87   Temp 98.9 F (37.2 C) (Oral)   Resp 16   Ht 5\' 3"  (1.6 m)   Wt 95.3 kg   LMP  (LMP Unknown) Comment: last period in Feb.   SpO2 100%   BMI 37.20 kg/m  I/O last 3 completed shifts: In: 1851.9 [I.V.:1551.9; IV Piggyback:300] Out: -  No intake/output data recorded.  SVE:   Dilation: 2 Effacement (%): Thick Station: -3 Exam by:: [employer], RN   Labs: Lab Results  Component Value Date   WBC 14.3 (H) 11/03/2018   HGB 10.3 (L) 11/03/2018   HCT 32.7 (L) 11/03/2018   MCV 71.6 (L) 11/03/2018   PLT 355 11/03/2018    Assessment / Plan: Normotensive Brief period of elevated oral temp now resolved, likely associated with Cytotec S/p Cytotec at 1728, total doses now five Discussed expectations for delivery, patient continues to request that she not be shown fetus following delivery   Kimberly Black, Kimberly Black 11/04/2018, 1745 PM

## 2018-11-05 ENCOUNTER — Inpatient Hospital Stay (HOSPITAL_COMMUNITY): Payer: Medicare Other

## 2018-11-05 ENCOUNTER — Inpatient Hospital Stay (HOSPITAL_COMMUNITY): Payer: Medicare Other | Admitting: Anesthesiology

## 2018-11-05 ENCOUNTER — Encounter (HOSPITAL_COMMUNITY): Payer: Self-pay | Admitting: *Deleted

## 2018-11-05 ENCOUNTER — Encounter (HOSPITAL_COMMUNITY)
Admission: AD | Disposition: A | Discharge status: Home / Self Care | Payer: Self-pay | Source: Home / Self Care | Attending: Obstetrics and Gynecology

## 2018-11-05 DIAGNOSIS — O0330 Unspecified complication following incomplete spontaneous abortion: Secondary | ICD-10-CM

## 2018-11-05 HISTORY — PX: DILATION AND EVACUATION: SHX1459

## 2018-11-05 LAB — CBC
HCT: 28.4 % — ABNORMAL LOW (ref 36.0–46.0)
Hemoglobin: 9.1 g/dL — ABNORMAL LOW (ref 12.0–15.0)
MCH: 23 pg — ABNORMAL LOW (ref 26.0–34.0)
MCHC: 32 g/dL (ref 30.0–36.0)
MCV: 71.7 fL — ABNORMAL LOW (ref 80.0–100.0)
Platelets: 302 10*3/uL (ref 150–400)
RBC: 3.96 MIL/uL (ref 3.87–5.11)
RDW: 17.2 % — ABNORMAL HIGH (ref 11.5–15.5)
WBC: 21.1 10*3/uL — ABNORMAL HIGH (ref 4.0–10.5)
nRBC: 0.1 % (ref 0.0–0.2)

## 2018-11-05 SURGERY — DILATION AND EVACUATION, UTERUS
Anesthesia: Monitor Anesthesia Care

## 2018-11-05 MED ORDER — KETOROLAC TROMETHAMINE 30 MG/ML IJ SOLN: 30.0000 mg | Freq: Once | INTRAMUSCULAR | Status: DC

## 2018-11-05 MED ORDER — SUCCINYLCHOLINE CHLORIDE 200 MG/10ML IV SOSY
PREFILLED_SYRINGE | INTRAVENOUS | Status: AC
  Filled 2018-11-05: qty 10

## 2018-11-05 MED ORDER — FENTANYL CITRATE (PF) 100 MCG/2ML IJ SOLN
INTRAMUSCULAR | Status: DC | PRN
  Administered 2018-11-05 (×3): 50 ug via INTRAVENOUS
  Administered 2018-11-05: 100 ug via INTRAVENOUS

## 2018-11-05 MED ORDER — MIDAZOLAM HCL 2 MG/2ML IJ SOLN
INTRAMUSCULAR | Status: AC
  Filled 2018-11-05: qty 2

## 2018-11-05 MED ORDER — ONDANSETRON HCL 4 MG/2ML IJ SOLN
INTRAMUSCULAR | Status: AC
  Filled 2018-11-05: qty 2

## 2018-11-05 MED ORDER — PROPOFOL 500 MG/50ML IV EMUL
INTRAVENOUS | Status: AC
  Filled 2018-11-05: qty 50

## 2018-11-05 MED ORDER — MISOPROSTOL 200 MCG PO TABS
600.0000 ug | ORAL_TABLET | Freq: Once | ORAL | Status: AC
  Administered 2018-11-05: 600 ug via BUCCAL

## 2018-11-05 MED ORDER — LACTATED RINGERS IV SOLN
INTRAVENOUS | Status: DC
  Administered 2018-11-05 – 2018-11-06 (×2): via INTRAVENOUS

## 2018-11-05 MED ORDER — FENTANYL CITRATE (PF) 250 MCG/5ML IJ SOLN
INTRAMUSCULAR | Status: AC
  Filled 2018-11-05: qty 5

## 2018-11-05 MED ORDER — METHYLERGONOVINE MALEATE 0.2 MG PO TABS
ORAL_TABLET | ORAL | Status: AC
  Filled 2018-11-05: qty 1

## 2018-11-05 MED ORDER — OXYTOCIN 40 UNITS IN NORMAL SALINE INFUSION - SIMPLE MED
INTRAVENOUS | Status: AC
  Filled 2018-11-05: qty 1000

## 2018-11-05 MED ORDER — ONDANSETRON HCL 4 MG/2ML IJ SOLN: 4.0000 mg | Freq: Four times a day (QID) | INTRAMUSCULAR | Status: DC | PRN

## 2018-11-05 MED ORDER — DOXYCYCLINE HYCLATE 100 MG IV SOLR
200.0000 mg | INTRAVENOUS | Status: AC
  Administered 2018-11-05: 200 mg via INTRAVENOUS
  Filled 2018-11-05: qty 200

## 2018-11-05 MED ORDER — POLYETHYLENE GLYCOL 3350 17 G PO PACK: 17.0000 g | PACK | Freq: Every day | ORAL | Status: DC | PRN

## 2018-11-05 MED ORDER — DOCUSATE SODIUM 100 MG PO CAPS
100.0000 mg | ORAL_CAPSULE | Freq: Two times a day (BID) | ORAL | Status: DC
  Administered 2018-11-05 – 2018-11-06 (×2): 100 mg via ORAL
  Filled 2018-11-05 (×2): qty 1

## 2018-11-05 MED ORDER — SODIUM CHLORIDE 0.9 % IV SOLN
INTRAVENOUS | Status: DC | PRN
  Administered 2018-11-05: 1.5 g via INTRAVENOUS

## 2018-11-05 MED ORDER — OXYCODONE HCL 5 MG PO TABS
5.0000 mg | ORAL_TABLET | ORAL | Status: DC | PRN
  Administered 2018-11-05: 10 mg via ORAL
  Filled 2018-11-05: qty 2

## 2018-11-05 MED ORDER — MIDAZOLAM HCL 2 MG/2ML IJ SOLN
INTRAMUSCULAR | Status: DC | PRN
  Administered 2018-11-05: 2 mg via INTRAVENOUS

## 2018-11-05 MED ORDER — ONDANSETRON HCL 4 MG/2ML IJ SOLN
INTRAMUSCULAR | Status: DC | PRN
  Administered 2018-11-05: 4 mg via INTRAVENOUS

## 2018-11-05 MED ORDER — SCOPOLAMINE 1 MG/3DAYS TD PT72
MEDICATED_PATCH | TRANSDERMAL | Status: AC
  Filled 2018-11-05: qty 1

## 2018-11-05 MED ORDER — SIMETHICONE 80 MG PO CHEW: 80.0000 mg | CHEWABLE_TABLET | Freq: Four times a day (QID) | ORAL | Status: DC | PRN

## 2018-11-05 MED ORDER — MISOPROSTOL 200 MCG PO TABS
600.0000 ug | ORAL_TABLET | Freq: Once | ORAL | Status: AC
  Administered 2018-11-05: 600 ug via ORAL
  Filled 2018-11-05: qty 3

## 2018-11-05 MED ORDER — PROPOFOL 500 MG/50ML IV EMUL
INTRAVENOUS | Status: DC | PRN
  Administered 2018-11-05: 150 ug/kg/min via INTRAVENOUS

## 2018-11-05 MED ORDER — LACTATED RINGERS IV SOLN
INTRAVENOUS | Status: DC
  Administered 2018-11-05 (×3): via INTRAVENOUS

## 2018-11-05 MED ORDER — IBUPROFEN 800 MG PO TABS
800.0000 mg | ORAL_TABLET | Freq: Three times a day (TID) | ORAL | Status: DC
  Administered 2018-11-05 – 2018-11-06 (×3): 800 mg via ORAL
  Filled 2018-11-05 (×3): qty 1

## 2018-11-05 MED ORDER — METHYLERGONOVINE MALEATE 0.2 MG PO TABS
0.2000 mg | ORAL_TABLET | Freq: Three times a day (TID) | ORAL | Status: DC
  Administered 2018-11-05 – 2018-11-06 (×3): 0.2 mg via ORAL
  Filled 2018-11-05 (×2): qty 1

## 2018-11-05 MED ORDER — MISOPROSTOL 200 MCG PO TABS
ORAL_TABLET | ORAL | Status: AC
  Filled 2018-11-05: qty 3

## 2018-11-05 MED ORDER — LIDOCAINE 2% (20 MG/ML) 5 ML SYRINGE
INTRAMUSCULAR | Status: AC
  Filled 2018-11-05: qty 5

## 2018-11-05 MED ORDER — PROPOFOL 500 MG/50ML IV EMUL
INTRAVENOUS | Status: DC | PRN
  Administered 2018-11-05: 150 ug via INTRAVENOUS

## 2018-11-05 MED ORDER — SCOPOLAMINE 1 MG/3DAYS TD PT72
1.0000 | MEDICATED_PATCH | TRANSDERMAL | Status: DC
  Administered 2018-11-05: 1.5 mg via TRANSDERMAL

## 2018-11-05 MED ORDER — PROPOFOL 10 MG/ML IV BOLUS
INTRAVENOUS | Status: AC
  Filled 2018-11-05: qty 20

## 2018-11-05 MED ORDER — LIDOCAINE HCL (CARDIAC) PF 100 MG/5ML IV SOSY
PREFILLED_SYRINGE | INTRAVENOUS | Status: DC | PRN
  Administered 2018-11-05: 60 mg via INTRAVENOUS

## 2018-11-05 MED ORDER — ONDANSETRON HCL 4 MG PO TABS: 4.0000 mg | ORAL_TABLET | Freq: Four times a day (QID) | ORAL | Status: DC | PRN

## 2018-11-05 SURGICAL SUPPLY — 21 items
CATH ROBINSON RED A/P 16FR (CATHETERS) ×3 IMPLANT
DECANTER SPIKE VIAL GLASS SM (MISCELLANEOUS) ×3 IMPLANT
GLOVE BIOGEL PI IND STRL 6.5 (GLOVE) ×1 IMPLANT
GLOVE BIOGEL PI IND STRL 7.0 (GLOVE) ×1 IMPLANT
GLOVE BIOGEL PI INDICATOR 6.5 (GLOVE) ×2
GLOVE BIOGEL PI INDICATOR 7.0 (GLOVE) ×2
GLOVE ORTHOPEDIC STR SZ6.5 (GLOVE) ×3 IMPLANT
GOWN STRL REUS W/ TWL LRG LVL3 (GOWN DISPOSABLE) ×2 IMPLANT
GOWN STRL REUS W/TWL LRG LVL3 (GOWN DISPOSABLE) ×6
HIBICLENS CHG 4% 4OZ BTL (MISCELLANEOUS) ×3 IMPLANT
KIT BERKELEY 1ST TRIMESTER 3/8 (MISCELLANEOUS) ×3 IMPLANT
NS IRRIG 1000ML POUR BTL (IV SOLUTION) ×3 IMPLANT
PACK VAGINAL MINOR WOMEN LF (CUSTOM PROCEDURE TRAY) ×3 IMPLANT
PAD OB MATERNITY 4.3X12.25 (PERSONAL CARE ITEMS) ×3 IMPLANT
PAD PREP 24X48 CUFFED NSTRL (MISCELLANEOUS) ×3 IMPLANT
SET BERKELEY SUCTION TUBING (SUCTIONS) ×3 IMPLANT
TOWEL OR 17X24 6PK STRL BLUE (TOWEL DISPOSABLE) ×6 IMPLANT
VACURETTE 10 RIGID CVD (CANNULA) IMPLANT
VACURETTE 7MM CVD STRL WRAP (CANNULA) IMPLANT
VACURETTE 8 RIGID CVD (CANNULA) IMPLANT
VACURETTE 9 RIGID CVD (CANNULA) IMPLANT

## 2018-11-05 NOTE — Op Note (Signed)
Kimberly Black PROCEDURE DATE:  11/05/2018  PREOPERATIVE DIAGNOSIS: retained placenta after induction of labor s/p attempted surgical D&E at 18 weeks, fever POSTOPERATIVE DIAGNOSIS: The same PROCEDURE: dilation and curettage under ultrasound guidance SURGEON:  Dr. Feliz Beam  INDICATIONS: 43 y.o.  T0V6979 who is s/p vaginal delivery at 18 weeks, who has retained placenta. Has not had delivery with multiple doses of cytotec. Recommended dilation and curettage for retained placenta in the OR. Risks of surgery were discussed with the patient including but not limited to: bleeding which may require transfusion; infection which may require antibiotics; injury to uterus or surrounding organs; need for additional procedures including laparotomy or laparoscopy; possibility of intrauterine scarring which may impair future fertility; and other postoperative/anesthesia complications. Written informed consent was obtained.  ABO, Rh: --/--/O POS, O POS Performed at [facility], [place] [address], [place], [place] [address]  [phone]/06 1658) CBC    Component Value Date/Time   WBC 21.1 (H) 11/05/2018 1519   RBC 3.96 11/05/2018 1519   HGB 9.1 (L) 11/05/2018 1519   HGB 11.0 (L) 02/21/2018 0940   HCT 28.4 (L) 11/05/2018 1519   HCT 35.3 02/21/2018 0940   PLT 302 11/05/2018 1519   PLT 425 02/21/2018 0940   MCV 71.7 (L) 11/05/2018 1519   MCV 71 (L) 02/21/2018 0940   MCH 23.0 (L) 11/05/2018 1519   MCHC 32.0 11/05/2018 1519   RDW 17.2 (H) 11/05/2018 1519   RDW 17.0 (H) 02/21/2018 0940   LYMPHSABS 1.4 11/03/2018 1656   MONOABS 1.0 11/03/2018 1656   EOSABS 0.0 11/03/2018 1656   BASOSABS 0.0 11/03/2018 1656     FINDINGS:  Retained placenta within uterus. Uterus with multiple large fibroids. Empty endometrial stripe noted on ultrasound at the end of the procedure.   ANESTHESIA:   Monitored intravenous sedation INTRAVENOUS FLUIDS:  1462mL of LR ESTIMATED BLOOD LOSS:  700 mL URINE OUTPUT: 50 mL  clear yellow urine SPECIMENS:  Retained placenta sent to pathology COMPLICATIONS:  None immediate.  PROCEDURE DETAILS:  The patient received intravenous Doxycycline while in the preoperative area.  She was then taken to the operating room where anesthesia was administered and was found to be adequate.  After an adequate timeout was performed, she was placed in the dorsal lithotomy position and examined; then prepped and draped in the sterile manner.   Her bladder was catheterized for return of clear, yellow urine as above. A vaginal speculum was then placed in the patient's vagina and an Alis clamp was applied to the anterior lip of the cervix.  The cervix was noted to be 2-3 cm dilated. A banjo curette was gently advanced to the uterine fundus under ultrasound guidance. This was difficult as she has a large right sided fibroid impeding into the cervical canal, however the instruments went in smoothly. Gentle curettage was performed until the endometrial cavity was cleared and a clean stripe was noted on the ultrasound. There was an empty endometrial stripe noted on the ultrasound at the end of the curettage. There was minimal bleeding noted at the end of the procedure, and the tenaculum removed with good hemostasis noted at the site of the Alis clamp.  All instruments were removed from the patient's vagina.  Sponge and instrument counts were correct times three.  The patient tolerated the procedure well and was taken to the recovery area awake, extubated and in stable condition.  The patient will be kept overnight for monitoring.    Feliz Beam,  M.D. Attending Center for [employer] farm manager)

## 2018-11-05 NOTE — Anesthesia Preprocedure Evaluation (Addendum)
Anesthesia Evaluation  Patient identified by MRN, date of birth, ID band Patient awake    Reviewed: Allergy & Precautions, NPO status , Patient's Chart, lab work & pertinent test results  Airway Mallampati: II  TM Distance: >3 FB Neck ROM: Full    Dental no notable dental hx. (+) Teeth Intact   Pulmonary neg pulmonary ROS,    Pulmonary exam normal breath sounds clear to auscultation       Cardiovascular negative cardio ROS Normal cardiovascular exam Rhythm:Regular Rate:Normal     Neuro/Psych negative neurological ROS  negative psych ROS   GI/Hepatic negative GI ROS, Neg liver ROS,   Endo/Other  Obesity  Renal/GU negative Renal ROS  negative genitourinary   Musculoskeletal negative musculoskeletal ROS (+)   Abdominal (+) + obese,   Peds  Hematology  (+) anemia ,   Anesthesia Other Findings   Reproductive/Obstetrics Retained placenta following 18 week TAB                             Anesthesia Physical Anesthesia Plan  ASA: II and emergent  Anesthesia Plan: MAC   Post-op Pain Management:    Induction: Intravenous  PONV Risk Score and Plan: 3 and Ondansetron, Midazolam and Treatment may vary due to age or medical condition  Airway Management Planned: Natural Airway and Simple Face Mask  Additional Equipment:   Intra-op Plan:   Post-operative Plan:   Informed Consent: I have reviewed the patients History and Physical, chart, labs and discussed the procedure including the risks, benefits and alternatives for the proposed anesthesia with the patient or authorized representative who has indicated his/her understanding and acceptance.     Dental advisory given  Plan Discussed with: CRNA and Surgeon  Anesthesia Plan Comments:       Anesthesia Quick Evaluation

## 2018-11-05 NOTE — Progress Notes (Signed)
Ultrasound paged

## 2018-11-05 NOTE — Progress Notes (Signed)
Faculty Note  In to see patient, minimal bleeding noted. Last cytotec 0845 this am. She is not feeling as much pain as she was earlier. On exam, cervix 2 cm dilated with bulging lower uterine segment, was able to pull some clot out of lower uterine segment but not placenta. Reviewed with patient that now as she is 6 hours s/p delivery with no placental delivery, would recommend proceeding to OR for D&C for retained placenta. She is agreeable to this plan. She is currently stable and not actively bleeding, will add on for [facility] for retained placenta after more urgent OR case is completed. Will give another dose of cytotec now.  The risks of D&C were reviewed with the patient; including but not limited to: infection which may require antibiotics; bleeding which may require transfusion or re-operation; injury to bowel, bladder, ureters or other surrounding organs; need for additional procedures; thromboembolic phenomenon and other postoperative/anesthesia complications. The patient concurred with the proposed plan, giving informed consent for the procedure. She is agreeable to a blood transfusion in the event of emergency.  Patient is NPO Anesthesia and OR staff aware Preoperative prophylactic antibiotics ordered SCDs  Admission labs To OR when ready   K. Arvilla Meres, M.D. Attending Center for [employer] [facility] farm manager)

## 2018-11-05 NOTE — Transfer of Care (Signed)
Immediate Anesthesia Transfer of Care Note  Patient: CERA RORKE  Procedure(s) Performed: DILATATION AND EVACUATION (N/A )  Patient Location: PACU  Anesthesia Type:MAC  Level of Consciousness: awake, alert  and oriented  Airway & Oxygen Therapy: Patient Spontanous Breathing  Post-op Assessment: Report given to RN and Post -op Vital signs reviewed and stable  Post vital signs: Reviewed and stable  Last Vitals:  Vitals Value Taken Time  BP 121/59 11/05/2018  6:52 PM  Temp 37.6 C 11/05/2018  6:52 PM  Pulse 82 11/05/2018  6:52 PM  Resp 20 11/05/2018  6:52 PM  SpO2      Last Pain:  Vitals:   11/05/18 1852  TempSrc: Oral  PainSc: 0-No pain         Complications: No apparent anesthesia complications

## 2018-11-05 NOTE — Anesthesia Postprocedure Evaluation (Signed)
Anesthesia Post Note  Patient: [name]  Procedure(s) Performed: DILATATION AND EVACUATION (N/A )     Patient location during evaluation: PACU Anesthesia Type: MAC Level of consciousness: awake and alert Pain management: pain level controlled Vital Signs Assessment: post-procedure vital signs reviewed and stable Respiratory status: spontaneous breathing, nonlabored ventilation, respiratory function stable and patient connected to nasal cannula oxygen Cardiovascular status: stable and blood pressure returned to baseline Postop Assessment: no apparent nausea or vomiting Anesthetic complications: no    Last Vitals:  Vitals:   11/05/18 1945 11/05/18 2005  BP: 118/71 122/71  Pulse: 84 84  Resp: 18 20  Temp:    SpO2:      Last Pain:  Vitals:   11/05/18 1915  TempSrc:   PainSc: 0-No pain   Pain Goal:                Epidural/Spinal Function Cutaneous sensation: Normal sensation (11/05/18 1929), Patient able to flex knees: Yes (11/05/18 1929), Patient able to lift hips off bed: Yes (11/05/18 1929), Back pain beyond tenderness at insertion site: No (11/05/18 1929), Progressively worsening motor and/or sensory loss: No (11/05/18 1929), Bowel and/or bladder incontinence post epidural: No (11/05/18 1929)  [name]

## 2018-11-05 NOTE — Progress Notes (Addendum)
Vitals:   11/04/18 2330 11/05/18 0011 11/05/18 0013 11/05/18 0152  BP:   118/68 (!) 103/57  Pulse:   93 86  Resp: 16  18 18   Temp:  100.2 F (37.9 C)    TempSrc:  Oral    SpO2:      Weight:      Height:       Temp down to 98.4 @ time of note  Patient doing okay, reports continued abdominal cramping, declines pain medication at this time   Dilation: 3 Effacement (%): 80 Cervical Position: Middle Station: -3 Presentation: Single Footling Breech Exam by:: Wende Bushy CNM   Cervical examination performed noted fetal parts in vaginal canal (single footling breech), cervix tight around fetal parts  Continue with Cytotec administration  Pain medication ordered PRN   Lajean Manes, CNM 11/05/18, 2:19 AM

## 2018-11-05 NOTE — Progress Notes (Signed)
Patient ID: Kimberly Black, female   DOB: [birth year], 43 y.o.   MRN: [id number]  Patient is now day 3 of admission for incomplete elective abortion, very prolonged course with medical induction with cytotec. Patient desires surgical management with D&E.  The risks of surgery were discussed in detail with the patient including but not limited to: bleeding which may require transfusion or reoperation; infection which may require prolonged hospitalization or re-hospitalization and antibiotic therapy; injury to bowel, bladder, ureters and major vessels or other surrounding organs; need for additional procedures including laparotomy; thromboembolic phenomenon, incisional problems and other postoperative or anesthesia complications.  Patient was told that the likelihood that her condition and symptoms will be treated effectively with this surgical management was very high; the postoperative expectations were also discussed in detail. The patient also understands the alternative treatment options which were discussed in full. All questions were answered.  I will discuss with Dr Rosana Hoes and Harolyn Rutherford who will perform the procedure  Woodroe Mode, MD 11/05/2018 7:30 AM
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
## 2018-11-05 NOTE — Progress Notes (Signed)
Faculty Practice OB/GYN Attending Note  Called to evaluate patient after delivery of fetus around [phone]. On arrival, fetus noted to be delivered and still attached via umbilical cord.  There were some blood clots noted, but no active bleeding. Cord was clamped and cut, fetus separated from patient.  Sterile vaginal exam revealed some more clots in the vagina, but placenta was not palpated in the cervix or lower uterine segment.  Cervix noted to be about 3 cm dilated.  EBL ~300 ml, will get quantitative estimation later.  Given retained placenta, patient was given option for additional misoprostol dosing or D&E. She desires the additional misoprostol for now, understands that D&E may be needed in the event of heavy bleeding, or if placenta does not deliver in a timely fashion or other indication. She will receive Misoprostol 600 mcg buccal dose now, will continue to observe closely. Continue NPO for now.  Anesthesia and OR team notified of the possibility of needing a D&E.   Verita Schneiders, MD, [place] for [employer], [place]

## 2018-11-05 NOTE — Progress Notes (Signed)
Faculty Practice OB/GYN Attending Note  Placenta undelivered after several hours, no hemorrhage. On exam, patient is about 2.5 cm dilated, able to palpate placental portion in lower uterine segment.  Patient asked to push a few times, but delivery did not happen.  Talked to patient about trying to deliver the placenta at bedside given its low location, discussed possibility of D&E if not successful. She agreed to try this. Of note, patient just received second dose of buccal misoprostol after delivery of fetus earlier today.  Patient positioned, sterile speculum placed.  Did one attempt of trying to grasp placenta with ring forceps, but patient reported being too uncomfortable and wanted to stop procedure. Desires procedure to be done under anesthesia.  Procedure stopped, all instruments removed from patient's vagina.  Will proceed with D&E soon under ultrasound guidance.  Intraoperative ultrasound ordered.  Doxycycline ordered for SCIP. To OR when ready.   Verita Schneiders, MD, [place] for [employer], [place]

## 2018-11-05 NOTE — Progress Notes (Signed)
Faculty Note  S: In to meet patient, she called out saying she felt like something was "coming out."   O: BP 126/65   Pulse 81   Temp 99.3 F (37.4 C) (Oral)   Resp 18   Ht 5\' 3"  (1.6 m)   Wt 95.3 kg   LMP  (LMP Unknown) Comment: last period in Feb.   SpO2 100%   BMI 37.20 kg/m   Gen: alert, oriented SVE: cervix 2 cm, placenta within uterus  Patient s/p delivery of fetus. Received buccal cytotec for 1 dose with plan for OR for retained placenta if no delivery of placenta with cytotec. She verbalizes understanding of plan, is agreeable to blood transfusion in the event of emergency.    Feliz Beam, M.D. Attending Center for [employer] Fish farm manager)

## 2018-11-05 NOTE — Progress Notes (Signed)
Vitals:   11/05/18 0208 11/05/18 0351 11/05/18 0444 11/05/18 0634  BP:  132/66 135/64 126/65  Pulse:  84 80 81  Resp:  18 16 18   Temp: 98.4 F (36.9 C) 98.7 F (37.1 C)  99.3 F (37.4 C)  TempSrc: Oral Oral  Oral  SpO2:      Weight:      Height:       Patient has a flat affect now and seems depressed, does not want pain medication even being in pain and not eating or drinking   Dilation: 3 Effacement (%): 80 Presentation: Single Footling Breech Exam by:: V Sterlin Knightly CNM   Called to room by RN after finding 1 fetal extremity at vaginal introitus, cervix unchanged but extremity off after manipulation of cervix- placed in specimen container.  Cytotec #8 given, current dosing of 215mcg  Normotensive and stable temp   Discussed with Dr Roselie Awkward plan of care for patient, discussed possibility of D&E- patient NPO at this time  Pain medication ordered PRN   Lajean Manes, CNM 11/05/18, 7:15 AM

## 2018-11-06 ENCOUNTER — Encounter (HOSPITAL_COMMUNITY): Payer: Self-pay | Admitting: Obstetrics and Gynecology

## 2018-11-06 LAB — CBC
HCT: 25.6 % — ABNORMAL LOW (ref 36.0–46.0)
Hemoglobin: 8 g/dL — ABNORMAL LOW (ref 12.0–15.0)
MCH: 22.7 pg — ABNORMAL LOW (ref 26.0–34.0)
MCHC: 31.3 g/dL (ref 30.0–36.0)
MCV: 72.5 fL — ABNORMAL LOW (ref 80.0–100.0)
Platelets: 273 10*3/uL (ref 150–400)
RBC: 3.53 MIL/uL — ABNORMAL LOW (ref 3.87–5.11)
RDW: 17.2 % — ABNORMAL HIGH (ref 11.5–15.5)
WBC: 18.3 10*3/uL — ABNORMAL HIGH (ref 4.0–10.5)
nRBC: 0.1 % (ref 0.0–0.2)

## 2018-11-06 MED ORDER — IBUPROFEN 800 MG PO TABS: 800.0000 mg | ORAL_TABLET | Freq: Three times a day (TID) | ORAL | 0 refills | Status: DC

## 2018-11-06 MED ORDER — OXYCODONE HCL 5 MG PO TABS: 5.0000 mg | ORAL_TABLET | ORAL | 0 refills | Status: DC | PRN

## 2018-11-06 MED ORDER — DOCUSATE SODIUM 100 MG PO CAPS: 100.0000 mg | ORAL_CAPSULE | Freq: Two times a day (BID) | ORAL | 1 refills | Status: DC

## 2018-11-06 NOTE — Progress Notes (Signed)
Discharge teaching complete with pt. Pt understood all information and did not have any questions. Pt discharged home with family.  

## 2018-11-06 NOTE — Progress Notes (Signed)
    Gynecology Progress Note  Admission Date: 11/03/2018 Current Date: 11/06/2018 7:50 AM  Kimberly Black is a 43 y.o. [id number] HD#3 admitted for incomplete termination with subsequent induction requiring D&E for delivery of retained placenta.  History complicated by: Patient Active Problem List   Diagnosis Date Noted  . Retained placenta 11/05/2018  . Incomplete abortion 11/04/2018  . Abortion, incomplete, with complications 01/74/9449  . Enlarged uterus 05/02/2017  . Fibroids 11/08/2013    Subjective:  Patient feeling well, still having some bleeding. Pain is much improved from last night. No issues with voiding. Has not had BM or passed gas yet. Denies nausea/vomiting. Tolerating regular diet. Denies light-headedness or dizziness.   Objective:   Vitals:   11/05/18 2136 11/05/18 2235 11/05/18 2336 11/06/18 0322  BP: 136/73 (!) 143/81 (!) 135/59 124/65  Pulse: 80 85 80 73  Resp: 16 18 18 16   Temp: 98.5 F (36.9 C) 98.3 F (36.8 C) 97.7 F (36.5 C) 97.8 F (36.6 C)  TempSrc: Oral Oral Oral Oral  SpO2: 100% 97% 100% 99%  Weight:      Height:        No intake/output data recorded.  Intake/Output Summary (Last 24 hours) at 11/06/2018 0750 Last data filed at 11/06/2018 0459 Gross per 24 hour  Intake 1400 ml  Output 3190 ml  Net -1790 ml     Physical exam: BP 124/65 (BP Location: Left Arm)   Pulse 73   Temp 97.8 F (36.6 C) (Oral)   Resp 16   Ht 5\' 3"  (1.6 m)   Wt 95.3 kg   LMP  (LMP Unknown) Comment: last period in Feb.   SpO2 99%   BMI 37.20 kg/m  CONSTITUTIONAL: Well-developed, well-nourished female in no acute distress.  HENT:  Normocephalic, atraumatic, External right and left ear normal. Oropharynx is clear and moist EYES: Conjunctivae and EOM are normal. Pupils are equal, round, and reactive to light. No scleral icterus.  NECK: Normal range of motion, supple, no masses.  Normal thyroid.  SKIN: Skin is warm and dry. No rash noted. Not diaphoretic. No  erythema. No pallor. NEUROLOGIC: Alert and oriented to person, place, and time. Normal reflexes, muscle tone coordination. No cranial nerve deficit noted. PSYCHIATRIC: Normal mood and affect. Normal behavior. Normal judgment and thought content. CARDIOVASCULAR: Normal heart rate noted RESPIRATORY: Effort normal, no problems with respiration noted. ABDOMEN: Soft, no distention noted. Enlarged fibroid uterus appropriately tender  PELVIC: deferred MUSCULOSKELETAL: Normal range of motion. No tenderness.  No cyanosis, clubbing, or edema.    UOP: voiding spontaneously  Labs  Recent Labs  Lab 11/03/18 1656 11/05/18 1519 11/06/18 0547  WBC 14.3* 21.1* 18.3*  HGB 10.3* 9.1* 8.0*  HCT 32.7* 28.4* 25.6*  PLT 355 302 273     Assessment & Plan:   Patient is 43 y.o. [id number] POD#1 s/p D&E for retained placenta after induction/delivery s/p incomplete D&E for 18 week TAB done at outside clinic. She is doing very well this am, pain is well controlled. H/H appropriate for EBL during surgery. Remains afebrile. Will give one additional dose of unasyn, complete 3 doses of methergine and plan for discharge later today as long as she is feeling well. She is agreeable to this plan and will need short term follow up at [facility].   Regular diet Pain meds prn unasyn x 1 dose Methergine x 2 doses Likely home later today   Feliz Beam, M.D. Attending Center for [employer] Fish farm manager)

## 2018-11-06 NOTE — Discharge Instructions (Signed)
Dilation and Curettage or Vacuum Curettage, Care After  These instructions give you information about caring for yourself after your procedure. Your doctor may also give you more specific instructions. Call your doctor if you have any problems or questions after your procedure.  Follow these instructions at home:  Activity  · Do not drive or use heavy machinery while taking prescription pain medicine.  · For 24 hours after your procedure, avoid driving.  · Take short walks often, followed by rest periods. Ask your doctor what activities are safe for you. After one or two days, you may be able to return to your normal activities.  · Do not lift anything that is heavier than 10 lb (4.5 kg) until your doctor approves.  · For at least 2 weeks, or as long as told by your doctor:  ? Do not douche.  ? Do not use tampons.  ? Do not have sex.  General instructions    · Take over-the-counter and prescription medicines only as told by your doctor. This is very important if you take blood thinning medicine.  · Do not take baths, swim, or use a hot tub until your doctor approves. Take showers instead of baths.  · Wear compression stockings as told by your doctor.  · It is up to you to get the results of your procedure. Ask your doctor when your results will be ready.  · Keep all follow-up visits as told by your doctor. This is important.  Contact a doctor if:  · You have very bad cramps that get worse or do not get better with medicine.  · You have very bad pain in your belly (abdomen).  · You cannot drink fluids without throwing up (vomiting).  · You get pain in a different part of the area between your belly and thighs (pelvis).  · You have bad-smelling discharge from your vagina.  · You have a rash.  Get help right away if:  · You are bleeding a lot from your vagina. A lot of bleeding means soaking more than one sanitary pad in an hour, for 2 hours in a row.  · You have clumps of blood (blood clots) coming from your  vagina.  · You have a fever or chills.  · Your belly feels very tender or hard.  · You have chest pain.  · You have trouble breathing.  · You cough up blood.  · You feel dizzy.  · You feel light-headed.  · You pass out (faint).  · You have pain in your neck or shoulder area.  Summary  · Take short walks often, followed by rest periods. Ask your doctor what activities are safe for you. After one or two days, you may be able to return to your normal activities.  · Do not lift anything that is heavier than 10 lb (4.5 kg) until your doctor approves.  · Do not take baths, swim, or use a hot tub until your doctor approves. Take showers instead of baths.  · Contact your doctor if you have any symptoms of infection, like bad-smelling discharge from your vagina.  This information is not intended to replace advice given to you by your health care provider. Make sure you discuss any questions you have with your health care provider.  Document Released: 02/23/2008 Document Revised: 02/01/2016 Document Reviewed: 02/01/2016  Elsevier Interactive Patient Education © 2019 [employer] Inc.

## 2018-11-06 NOTE — Discharge Summary (Signed)
OB Discharge Summary     Patient Name: Kimberly Black DOB: Oct 03, 1975 MRN: [id number]  Date of admission: 11/03/2018 Delivering MD: Verita Schneiders A   Date of discharge: 11/06/2018  Admitting diagnosis: Pregnancy Intrauterine pregnancy: [redacted]w[redacted]d     Secondary diagnosis:  Principal Problem:   Abortion, incomplete, with complications Active Problems:   Fibroids   Anemia   Incomplete abortion   Retained placenta  Additional problems: Anemia     Discharge diagnosis: Complete TAB, s/p induction and D&E for retained placenta                                                                               Post partum procedures:curettage   Augmentation: Cytotec  Complications: none  Hospital course:  Patient is 43 yo [id number] who presented via EMS from [facility] for inability to complete 2nd trimester D&E. She underwent a prolonged misoprostol induction. Developed fever and was started on antibiotics. She delivered the fetus on HD#3. Despite multiple additional cytotec administrations, she did not deliver placenta and was taken to OR for D&E under ultrasound guidance for retained placenta; see operative note for more details. She had an uncomplicated post op course. Sent home in stable condition on POD#1.  Physical exam  Vitals:   11/05/18 2235 11/05/18 2336 11/06/18 0322 11/06/18 0835  BP: (!) 143/81 (!) 135/59 124/65 (!) 134/45  Pulse: 85 80 73 81  Resp: 18 18 16 17   Temp: 98.3 F (36.8 C) 97.7 F (36.5 C) 97.8 F (36.6 C) 97.6 F (36.4 C)  TempSrc: Oral Oral Oral Oral  SpO2: 97% 100% 99% 99%  Weight:      Height:       General: alert, cooperative and no distress Lochia: appropriate Uterine Fundus: firm, enlarged due to fibroids Incision: N/A DVT Evaluation: No evidence of DVT seen on physical exam. Negative Homan's sign. Labs: Lab Results  Component Value Date   WBC 18.3 (H) 11/06/2018   HGB 8.0 (L) 11/06/2018   HCT 25.6 (L) 11/06/2018   MCV 72.5 (L)  11/06/2018   PLT 273 11/06/2018   CMP Latest Ref Rng & Units 02/21/2018  Glucose 65 - 99 mg/dL 94  BUN 6 - 24 mg/dL 9  Creatinine 0.57 - 1.00 mg/dL 0.73  Sodium 134 - 144 mmol/L 140  Potassium 3.5 - 5.2 mmol/L 4.1  Chloride 96 - 106 mmol/L 102  CO2 20 - 29 mmol/L 23  Calcium 8.7 - 10.2 mg/dL 9.7  Total Protein 6.0 - 8.5 g/dL 7.1  Total Bilirubin 0.0 - 1.2 mg/dL 0.2  Alkaline Phos 39 - 117 IU/L 66  AST 0 - 40 IU/L 16  ALT 0 - 32 IU/L 18    Discharge instruction: per After Visit Summary and "Baby and Me Booklet".  After visit meds:  Allergies as of 11/06/2018   No Known Allergies     Medication List    TAKE these medications   docusate sodium 100 MG capsule Commonly known as:  COLACE Take 1 capsule (100 mg total) by mouth 2 (two) times daily.   ibuprofen 800 MG tablet Commonly known as:  ADVIL Take 1 tablet (800 mg total) by mouth every 8 (eight)  hours.   oxyCODONE 5 MG immediate release tablet Commonly known as:  Oxy IR/ROXICODONE Take 1 tablet (5 mg total) by mouth every 4 (four) hours as needed for severe pain or breakthrough pain.       Diet: routine diet  Activity: Advance as tolerated. Pelvic rest for 6 weeks.   Future Appointments  Date Time Provider [place]  11/12/2018 10:00 AM Jonnie Kind, MD [facility]   Postpartum contraception: Not Discussed    Verita Schneiders, MD, [place], [facility] for [employer], [place]

## 2018-11-07 ENCOUNTER — Encounter (HOSPITAL_COMMUNITY): Payer: Self-pay | Admitting: Obstetrics and Gynecology

## 2018-11-09 ENCOUNTER — Emergency Department (HOSPITAL_COMMUNITY): Payer: Medicare Other

## 2018-11-09 ENCOUNTER — Inpatient Hospital Stay (HOSPITAL_COMMUNITY)
Admission: EM | Admit: 2018-11-09 | Discharge: 2018-11-12 | DRG: 779 | Disposition: A | Discharge status: Home / Self Care | Payer: Medicare Other | Attending: Obstetrics & Gynecology | Admitting: Obstetrics & Gynecology

## 2018-11-09 ENCOUNTER — Encounter (HOSPITAL_COMMUNITY): Payer: Self-pay | Admitting: Emergency Medicine

## 2018-11-09 DIAGNOSIS — R1011 Right upper quadrant pain: Secondary | ICD-10-CM

## 2018-11-09 DIAGNOSIS — N719 Inflammatory disease of uterus, unspecified: Secondary | ICD-10-CM | POA: Diagnosis not present

## 2018-11-09 DIAGNOSIS — J189 Pneumonia, unspecified organism: Secondary | ICD-10-CM

## 2018-11-09 DIAGNOSIS — O035 Genital tract and pelvic infection following complete or unspecified spontaneous abortion: Principal | ICD-10-CM | POA: Diagnosis present

## 2018-11-09 DIAGNOSIS — D259 Leiomyoma of uterus, unspecified: Secondary | ICD-10-CM | POA: Diagnosis present

## 2018-11-09 DIAGNOSIS — Z1159 Encounter for screening for other viral diseases: Secondary | ICD-10-CM

## 2018-11-09 DIAGNOSIS — K5900 Constipation, unspecified: Secondary | ICD-10-CM | POA: Diagnosis present

## 2018-11-09 LAB — HCG, QUANTITATIVE, PREGNANCY: hCG, Beta Chain, Quant, S: 136 m[IU]/mL — ABNORMAL HIGH (ref <5)

## 2018-11-09 LAB — URINALYSIS, ROUTINE W REFLEX MICROSCOPIC
Bilirubin Urine: NEGATIVE
Glucose, UA: NEGATIVE mg/dL
Ketones, ur: NEGATIVE mg/dL
Nitrite: NEGATIVE
Protein, ur: 100 mg/dL — AB
RBC / HPF: >50 RBC/hpf — ABNORMAL HIGH (ref 0–5)
Specific Gravity, Urine: 1.015 (ref 1.005–1.030)
pH: 7 (ref 5.0–8.0)

## 2018-11-09 LAB — COMPREHENSIVE METABOLIC PANEL
ALT: 35 U/L (ref 0–44)
AST: 31 U/L (ref 15–41)
Albumin: 2.6 g/dL — ABNORMAL LOW (ref 3.5–5.0)
Alkaline Phosphatase: 71 U/L (ref 38–126)
Anion gap: 10 (ref 5–15)
BUN: 5 mg/dL — ABNORMAL LOW (ref 6–20)
CO2: 25 mmol/L (ref 22–32)
Calcium: 8.7 mg/dL — ABNORMAL LOW (ref 8.9–10.3)
Chloride: 102 mmol/L (ref 98–111)
Creatinine, Ser: 0.48 mg/dL (ref 0.44–1.00)
GFR calc Af Amer: >60 mL/min (ref >60)
GFR calc non Af Amer: >60 mL/min (ref >60)
Glucose, Bld: 111 mg/dL — ABNORMAL HIGH (ref 70–99)
Potassium: 3 mmol/L — ABNORMAL LOW (ref 3.5–5.1)
Sodium: 137 mmol/L (ref 135–145)
Total Bilirubin: 0.1 mg/dL — ABNORMAL LOW (ref 0.3–1.2)
Total Protein: 6.4 g/dL — ABNORMAL LOW (ref 6.5–8.1)

## 2018-11-09 LAB — CBC
HCT: 25 % — ABNORMAL LOW (ref 36.0–46.0)
Hemoglobin: 7.6 g/dL — ABNORMAL LOW (ref 12.0–15.0)
MCH: 22 pg — ABNORMAL LOW (ref 26.0–34.0)
MCHC: 30.4 g/dL (ref 30.0–36.0)
MCV: 72.5 fL — ABNORMAL LOW (ref 80.0–100.0)
Platelets: 456 10*3/uL — ABNORMAL HIGH (ref 150–400)
RBC: 3.45 MIL/uL — ABNORMAL LOW (ref 3.87–5.11)
RDW: 16.9 % — ABNORMAL HIGH (ref 11.5–15.5)
WBC: 20.9 10*3/uL — ABNORMAL HIGH (ref 4.0–10.5)
nRBC: 0.9 % — ABNORMAL HIGH (ref 0.0–0.2)

## 2018-11-09 LAB — LIPASE, BLOOD: Lipase: 21 U/L (ref 11–51)

## 2018-11-09 MED ORDER — MORPHINE SULFATE (PF) 4 MG/ML IV SOLN
4.0000 mg | Freq: Once | INTRAVENOUS | Status: AC
  Administered 2018-11-10: 4 mg via INTRAVENOUS
  Filled 2018-11-09: qty 1

## 2018-11-09 MED ORDER — SODIUM CHLORIDE 0.9 % IV BOLUS (SEPSIS)
1000.0000 mL | Freq: Once | INTRAVENOUS | Status: AC
  Administered 2018-11-10: 1000 mL via INTRAVENOUS

## 2018-11-09 MED ORDER — SODIUM CHLORIDE 0.9% FLUSH
3.0000 mL | Freq: Once | INTRAVENOUS | Status: AC
  Administered 2018-11-10: 3 mL via INTRAVENOUS

## 2018-11-09 MED ORDER — ONDANSETRON HCL 4 MG/2ML IJ SOLN
4.0000 mg | Freq: Once | INTRAMUSCULAR | Status: AC
  Administered 2018-11-10: 4 mg via INTRAVENOUS
  Filled 2018-11-09: qty 2

## 2018-11-09 MED ORDER — SODIUM CHLORIDE 0.9 % IV BOLUS (SEPSIS): 1000.0000 mL | Freq: Once | INTRAVENOUS | Status: DC

## 2018-11-09 NOTE — ED Notes (Signed)
Pt taken to ultra sound before I could go in and see her

## 2018-11-09 NOTE — ED Provider Notes (Signed)
TIME SEEN: 11:23 PM  CHIEF COMPLAINT: Abdominal pain  HPI: Patient is a 43 year old [id number] female with history of uterine fibroids who presents to the emergency department with abdominal pain.  She reports that on June 6 she had an elective abortion at [facility].  He states "they were unable to complete it" and she had to have a D and E here in the hospital with Dr. Rosana Hoes on June 8.  It appears that she had a prolonged [facility]ol [facility].  She delivered an 25 weeks old fetus on hospital day #3.  Despite multiple Cytotec administrations, patient did not deliver the placenta and therefore was taken to the operating room.  She was febrile in the hospital and had a leukocytosis and did receive antibiotics but did not go home on antibiotics.  Has been taking Percocet and ibuprofen without relief.  At the completion of her DME in the hospital she had an ultrasound in the operating room that showed no retained products of conception with thin endometrial stripe.  She reports that her bleeding has slowly improved and she is not passing clots.  She has had pain in the lower abdomen since that time but is now having pain in the right upper quadrant, subjective fevers, nausea.  No chest pain, shortness of breath, cough.  No vomiting or diarrhea.  No dysuria.  Patient denies other previous abdominal surgeries.  Her abdomen is distended but she states that that this has been chronic for her.  She states that she is having bowel movements but has been constipated.  She is passing gas.    ROS: See HPI Constitutional:  fever  Eyes: no drainage  ENT: no runny nose   Cardiovascular:  no chest pain  Resp: no SOB  GI: no vomiting GU: no dysuria Integumentary: no rash  Allergy: no hives  Musculoskeletal: no leg swelling  Neurological: no slurred speech ROS otherwise negative  PAST MEDICAL HISTORY/PAST SURGICAL HISTORY:  Past Medical History:  Diagnosis Date  . Acne   . Anemia  11/25/2013  . BV (bacterial vaginosis)   . Fibroids    uterine  . History of trichomoniasis   . Menorrhagia 11/08/2013  . Obesity   . Vaginal discharge 01/07/2015  . Vaginal odor 11/08/2013    MEDICATIONS:  Prior to Admission medications   Medication Sig Start Date End Date Taking? Authorizing Provider  docusate sodium (COLACE) 100 MG capsule Take 1 capsule (100 mg total) by mouth 2 (two) times daily. 11/06/18   Anyanwu, Sallyanne Havers, MD  ibuprofen (ADVIL) 800 MG tablet Take 1 tablet (800 mg total) by mouth every 8 (eight) hours. 11/06/18   Anyanwu, Sallyanne Havers, MD  oxyCODONE (OXY IR/ROXICODONE) 5 MG immediate release tablet Take 1 tablet (5 mg total) by mouth every 4 (four) hours as needed for severe pain or breakthrough pain. 11/06/18   Anyanwu, Sallyanne Havers, MD    ALLERGIES:  No Known Allergies  SOCIAL HISTORY:  Social History   Tobacco Use  . Smoking status: Never Smoker  . Smokeless tobacco: Never Used  Substance Use Topics  . Alcohol use: No    FAMILY HISTORY: Family History  Problem Relation Age of Onset  . Hypertension Mother   . Diabetes Father   . Cancer Maternal Grandfather   . Asthma Son   . Cancer Maternal Aunt     EXAM: BP 138/67 (BP Location: Right Arm)   Pulse (!) 107   Temp 100 F (37.8 C) (Oral)  Resp 18   Ht 5\' 3"  (1.6 m)   Wt 95.3 kg   LMP  (LMP Unknown) Comment: last period in Feb.   SpO2 97%   BMI 37.20 kg/m  CONSTITUTIONAL: Alert and oriented and responds appropriately to questions.  Appears uncomfortable, obese HEAD: Normocephalic EYES: Conjunctivae clear, pupils appear equal, EOMI ENT: normal nose; moist mucous membranes NECK: Supple, no meningismus, no nuchal rigidity, no LAD  CARD: Regular and tachycardic; S1 and S2 appreciated; no murmurs, no clicks, no rubs, no gallops RESP: Normal chest excursion without splinting or tachypnea; breath sounds clear and equal bilaterally; no wheezes, no rhonchi, no rales, no hypoxia or respiratory distress, speaking  full sentences ABD/GI: Normal bowel sounds; she is tender to palpation diffusely especially throughout the lower abdomen and in the right upper quadrant with guarding and rebound.  Her abdomen is distended which she states has been like that "a while".  There is no tympany or fluid wave. GU:  Normal external genitalia. No lesions, rashes noted. Patient has mild to moderate dark red vaginal bleeding on exam without clots.  No vaginal discharge.  Sterile exam performed.  No bimanual exam performed.  Cervix is slightly open.  Chaperone present for exam. BACK:  The back appears normal and is non-tender to palpation, there is no CVA tenderness EXT: Normal ROM in all joints; non-tender to palpation; no edema; normal capillary refill; no cyanosis, no calf tenderness or swelling    SKIN: Normal color for age and race; warm; no rash NEURO: Moves all extremities equally PSYCH: The patient's mood and manner are appropriate. Grooming and personal hygiene are appropriate.  MEDICAL DECISION MAKING: Patient here with abdominal pain, fevers, leukocytosis.  Recently underwent procedure for D&E for retained placenta after prolonged misoprostol induction for elective abortion.  Differential includes endometritis, appendicitis, UTI, cholecystitis, pancreatitis, colitis.  Will obtain labs, urine.  Will start with ultrasound of gallbladder and pelvic organs.  Will obtain blood cultures, lactate.  I am concerned for possible sepsis.  Will give IV fluids, pain and nausea medicine.  ED PROGRESS: Patient's white blood cell count is elevated here today.  Her rectal temperature is 101.2.  Her lactate is normal.  Urine shows blood but no other sign of infection but she does have vaginal bleeding today.  Right upper quadrant ultrasound and transvaginal ultrasound showed no acute abnormality.  She has uterine fibroids that limit evaluation of her endometrium.  Will obtain CT of the abdomen pelvis for further evaluation.   2:35 AM   Pt's CT scan shows very lobar large heterogeneous uterus likely from her multiple fibroids.  She has pockets of gas in the lower uterine segment likely due to surgical intervention.  Also appears to have bilateral lower lobe pneumonia.  Her coronavirus testing is pending.  I have talked to Dr. Elonda Husky on call with OB/GYN.  I appreciate his help.  We will cover her for sepsis for both healthcare associated pneumonia and endometritis with vancomycin and Zosyn.  He feels the Zosyn would be appropriate for endometritis.  Given her pneumonia, he recommends medicine admission to the hospitalist service.  They will follow the patient in consult.  I have updated patient with this plan.  2:56 AM Discussed patient's case with hospitalist, Dr. Blaine Hamper.   He will speak directly to OB/GYN on-call prior to admitting patient.  3:02 AM  If COVID positive, Dr. Blaine Hamper will admit to [facility],  If COVID negative, Dr. [facility]ky [facility]  3:24 AM  Pt's  COVID swab is negative.  Dr. Elonda Husky with OB/GYN will admit.  Recommends admitting to inpatient, medical/surgical bed.  I will place holding orders per OB/GYN request.  Patient updated with plan.  I reviewed all nursing notes, vitals, pertinent previous records, EKGs, lab and urine results, imaging (as available).   CRITICAL CARE Performed by: Pryor Curia   Total critical care time: 65 minutes  Critical care time was exclusive of separately billable procedures and treating other patients.  Critical care was necessary to treat or prevent imminent or life-threatening deterioration.  Critical care was time spent personally by me on the following activities: development of treatment plan with patient and/or surrogate as well as nursing, discussions with consultants, evaluation of patient's response to treatment, examination of patient, obtaining history from patient or surrogate, ordering and performing treatments and interventions, ordering and review of laboratory studies,  ordering and review of radiographic studies, pulse oximetry and re-evaluation of patient's condition.    Ward, Delice Bison, DO 11/10/18 [phone]

## 2018-11-09 NOTE — ED Triage Notes (Signed)
Pt in [place], was released from hospital 11/06/2018. Pt admitted to hospital after failed therapeutic abortion at an outside clinic. Pt had surgery performed to removed retained placenta. Pt reports inc in RLQ abd pain since being released. Denies N/V/D.

## 2018-11-10 ENCOUNTER — Emergency Department (HOSPITAL_COMMUNITY): Payer: Medicare Other

## 2018-11-10 DIAGNOSIS — N719 Inflammatory disease of uterus, unspecified: Secondary | ICD-10-CM

## 2018-11-10 DIAGNOSIS — D219 Benign neoplasm of connective and other soft tissue, unspecified: Secondary | ICD-10-CM | POA: Diagnosis not present

## 2018-11-10 DIAGNOSIS — O035 Genital tract and pelvic infection following complete or unspecified spontaneous abortion: Secondary | ICD-10-CM | POA: Diagnosis present

## 2018-11-10 DIAGNOSIS — D259 Leiomyoma of uterus, unspecified: Secondary | ICD-10-CM | POA: Diagnosis present

## 2018-11-10 DIAGNOSIS — Z1159 Encounter for screening for other viral diseases: Secondary | ICD-10-CM | POA: Diagnosis not present

## 2018-11-10 DIAGNOSIS — K5900 Constipation, unspecified: Secondary | ICD-10-CM | POA: Diagnosis present

## 2018-11-10 LAB — WET PREP, GENITAL
Clue Cells Wet Prep HPF POC: NONE SEEN
Sperm: NONE SEEN
Trich, Wet Prep: NONE SEEN
Yeast Wet Prep HPF POC: NONE SEEN

## 2018-11-10 LAB — CBC
HCT: 24.3 % — ABNORMAL LOW (ref 36.0–46.0)
Hemoglobin: 7.4 g/dL — ABNORMAL LOW (ref 12.0–15.0)
MCH: 22.1 pg — ABNORMAL LOW (ref 26.0–34.0)
MCHC: 30.5 g/dL (ref 30.0–36.0)
MCV: 72.5 fL — ABNORMAL LOW (ref 80.0–100.0)
Platelets: 427 10*3/uL — ABNORMAL HIGH (ref 150–400)
RBC: 3.35 MIL/uL — ABNORMAL LOW (ref 3.87–5.11)
RDW: 17 % — ABNORMAL HIGH (ref 11.5–15.5)
WBC: 20.8 10*3/uL — ABNORMAL HIGH (ref 4.0–10.5)
nRBC: 0.7 % — ABNORMAL HIGH (ref 0.0–0.2)

## 2018-11-10 LAB — LACTIC ACID, PLASMA: Lactic Acid, Venous: 1 mmol/L (ref 0.5–1.9)

## 2018-11-10 LAB — SARS CORONAVIRUS 2: SARS Coronavirus 2: NOT DETECTED

## 2018-11-10 MED ORDER — HYDROMORPHONE HCL 1 MG/ML IJ SOLN
1.0000 mg | Freq: Once | INTRAMUSCULAR | Status: AC
  Administered 2018-11-10: 1 mg via INTRAVENOUS
  Filled 2018-11-10: qty 1

## 2018-11-10 MED ORDER — PIPERACILLIN-TAZOBACTAM 3.375 G IVPB 30 MIN
3.3750 g | Freq: Once | INTRAVENOUS | Status: AC
  Administered 2018-11-10: 3.375 g via INTRAVENOUS
  Filled 2018-11-10: qty 50

## 2018-11-10 MED ORDER — VANCOMYCIN HCL IN DEXTROSE 1-5 GM/200ML-% IV SOLN
1000.0000 mg | Freq: Once | INTRAVENOUS | Status: AC
  Administered 2018-11-10: 1000 mg via INTRAVENOUS
  Filled 2018-11-10: qty 200

## 2018-11-10 MED ORDER — ACETAMINOPHEN 500 MG PO TABS
1000.0000 mg | ORAL_TABLET | Freq: Four times a day (QID) | ORAL | Status: DC | PRN
  Administered 2018-11-10 – 2018-11-11 (×5): 1000 mg via ORAL
  Filled 2018-11-10 (×5): qty 2

## 2018-11-10 MED ORDER — PIPERACILLIN-TAZOBACTAM 3.375 G IVPB
3.3750 g | Freq: Three times a day (TID) | INTRAVENOUS | Status: DC
  Administered 2018-11-10 – 2018-11-12 (×7): 3.375 g via INTRAVENOUS
  Filled 2018-11-10 (×9): qty 50

## 2018-11-10 MED ORDER — SODIUM CHLORIDE 0.9 % IV SOLN
INTRAVENOUS | Status: DC
  Administered 2018-11-10 – 2018-11-11 (×4): via INTRAVENOUS

## 2018-11-10 MED ORDER — HYDROMORPHONE HCL 1 MG/ML IJ SOLN
1.0000 mg | INTRAMUSCULAR | Status: AC | PRN
  Administered 2018-11-10: 1 mg via INTRAVENOUS
  Filled 2018-11-10: qty 1

## 2018-11-10 MED ORDER — ONDANSETRON HCL 4 MG/2ML IJ SOLN: 4.0000 mg | Freq: Three times a day (TID) | INTRAMUSCULAR | Status: AC | PRN

## 2018-11-10 MED ORDER — ENOXAPARIN SODIUM 40 MG/0.4ML ~~LOC~~ SOLN
40.0000 mg | SUBCUTANEOUS | Status: DC
  Administered 2018-11-10 – 2018-11-12 (×3): 40 mg via SUBCUTANEOUS
  Filled 2018-11-10 (×3): qty 0.4

## 2018-11-10 MED ORDER — IOHEXOL 300 MG/ML  SOLN
100.0000 mL | Freq: Once | INTRAMUSCULAR | Status: AC | PRN
  Administered 2018-11-10: 100 mL via INTRAVENOUS

## 2018-11-10 MED ORDER — ACETAMINOPHEN 500 MG PO TABS
1000.0000 mg | ORAL_TABLET | Freq: Once | ORAL | Status: AC
  Administered 2018-11-10: 1000 mg via ORAL
  Filled 2018-11-10: qty 2

## 2018-11-10 NOTE — H&P (Signed)
Preoperative History and Physical  Kimberly Black is a 43 y.o. [phone] with No LMP recorded (lmp unknown). Patient is pregnant. admitted for a post abortal endoms huge fibroids and was pregnant and had a complicated pregnancy termination See previous notes for details  She presents with fever severe lower abdominal pain and elevated WBC  PMH:    Past Medical History:  Diagnosis Date  . Acne   . Anemia 11/25/2013  . BV (bacterial vaginosis)   . Fibroids    uterine  . History of trichomoniasis   . Menorrhagia 11/08/2013  . Obesity   . Vaginal discharge 01/07/2015  . Vaginal odor 11/08/2013    PSH:     Past Surgical History:  Procedure Laterality Date  . DILATION AND EVACUATION N/A 11/05/2018   Procedure: DILATATION AND EVACUATION;  Surgeon: Sloan Leiter, MD;  Location: [facility];  Service: Gynecology;  Laterality: N/A;  . NO PAST SURGERIES      POb/GynH:      OB History    Gravida  4   Para  2   Term  1   Preterm      AB  2   Living  1     SAB      TAB  2   Ectopic      Multiple  0   Live Births  1           SH:   Social History   Tobacco Use  . Smoking status: Never Smoker  . Smokeless tobacco: Never Used  Substance Use Topics  . Alcohol use: No  . Drug use: No    FH:    Family History  Problem Relation Age of Onset  . Hypertension Mother   . Diabetes Father   . Cancer Maternal Grandfather   . Asthma Son   . Cancer Maternal Aunt      Allergies: No Known Allergies  Medications:       Current Facility-Administered Medications:  .  0.9 %  sodium chloride infusion, , Intravenous, Continuous, Ward, Kristen N, DO, Last Rate: 125 mL/hr at 11/10/18 0517 .  acetaminophen (TYLENOL) tablet 1,000 mg, 1,000 mg, Oral, Q6H PRN, Ward, Kristen N, DO .  enoxaparin (LOVENOX) injection 40 mg, 40 mg, Subcutaneous, Q24H, Florian Buff, MD, 40 mg at 11/10/18 0807 .  HYDROmorphone (DILAUDID) injection 1 mg, 1 mg, Intravenous, Q4H PRN, Ward, Kristen N,  DO, 1 mg at 11/10/18 0807 .  ondansetron (ZOFRAN) injection 4 mg, 4 mg, Intravenous, Q8H PRN, Ward, Kristen N, DO  Review of Systems:   Review of Systems  Constitutional: Negative for fever, chills, weight loss, malaise/fatigue and diaphoresis.  HENT: Negative for hearing loss, ear pain, nosebleeds, congestion, sore throat, neck pain, tinnitus and ear discharge.   Eyes: Negative for blurred vision, double vision, photophobia, pain, discharge and redness.  Respiratory: Negative for cough, hemoptysis, sputum production, shortness of breath, wheezing and stridor.   Cardiovascular: Negative for chest pain, palpitations, orthopnea, claudication, leg swelling and PND.  Gastrointestinal: Positive for abdominal pain. Negative for heartburn, nausea, vomiting, diarrhea, constipation, blood in stool and melena.  Genitourinary: Negative for dysuria, urgency, frequency, hematuria and flank pain.  Musculoskeletal: Negative for myalgias, back pain, joint pain and falls.  Skin: Negative for itching and rash.  Neurological: Negative for dizziness, tingling, tremors, sensory change, speech change, focal weakness, seizures, loss of consciousness, weakness and headaches.  Endo/Heme/Allergies: Negative for environmental allergies and polydipsia. Does not bruise/bleed easily.  Psychiatric/Behavioral: Negative for depression, suicidal ideas, hallucinations, memory loss and substance abuse. The patient is not nervous/anxious and does not have insomnia.      PHYSICAL EXAM:  Blood pressure 139/85, pulse 91, temperature 98.6 F (37 C), temperature source Axillary, resp. rate 17, height 5\' 3"  (1.6 m), weight 103.5 kg, SpO2 100 %.    Vitals reviewed. Constitutional: She is oriented to person, place, and time. She appears well-developed and well-nourished.  HENT:  Head: Normocephalic and atraumatic.  Right Ear: External ear normal.  Left Ear: External ear normal.  Nose: Nose normal.  Mouth/Throat: Oropharynx is  clear and moist.  Eyes: Conjunctivae and EOM are normal. Pupils are equal, round, and reactive to light. Right eye exhibits no discharge. Left eye exhibits no discharge. No scleral icterus.  Neck: Normal range of motion. Neck supple. No tracheal deviation present. No thyromegaly present.  Cardiovascular: Normal rate, regular rhythm, normal heart sounds and intact distal pulses.  Exam reveals no gallop and no friction rub.   No murmur heard. Respiratory: Effort normal and breath sounds normal. No respiratory distress. She has no wheezes. She has no rales. She exhibits no tenderness.  GI: Soft. Bowel sounds are normal. She exhibits no distension and no mass. There is tenderness. There is no rebound and no guarding.  Genitourinary:       Vulva is normal without lesions Vagina is pink moist without discharge Cervix normal in appearance and pap is normal Uterus is enlarged, 34 weeks size Adnexa is negative with normal sized ovaries by sonogram  Musculoskeletal: Normal range of motion. She exhibits no edema and no tenderness.  Neurological: She is alert and oriented to person, place, and time. She has normal reflexes. She displays normal reflexes. No cranial nerve deficit. She exhibits normal muscle tone. Coordination normal.  Skin: Skin is warm and dry. No rash noted. No erythema. No pallor.  Psychiatric: She has a normal mood and affect. Her behavior is normal. Judgment and thought content normal.    Labs: Results for orders placed or performed during the hospital encounter of 11/09/18 (from the past 336 hour(s))  Lipase, blood   Collection Time: 11/09/18 10:17 PM  Result Value Ref Range   Lipase 21 11 - 51 U/L  Comprehensive metabolic panel   Collection Time: 11/09/18 10:17 PM  Result Value Ref Range   Sodium 137 135 - 145 mmol/L   Potassium 3.0 (L) 3.5 - 5.1 mmol/L   Chloride 102 98 - 111 mmol/L   CO2 25 22 - 32 mmol/L   Glucose, Bld 111 (H) 70 - 99 mg/dL   BUN 5 (L) 6 - 20 mg/dL    Creatinine, Ser 0.48 0.44 - 1.00 mg/dL   Calcium 8.7 (L) 8.9 - 10.3 mg/dL   Total Protein 6.4 (L) 6.5 - 8.1 g/dL   Albumin 2.6 (L) 3.5 - 5.0 g/dL   AST 31 15 - 41 U/L   ALT 35 0 - 44 U/L   Alkaline Phosphatase 71 38 - 126 U/L   Total Bilirubin 0.1 (L) 0.3 - 1.2 mg/dL   GFR calc non Af Amer >60 >60 mL/min   GFR calc Af Amer >60 >60 mL/min   Anion gap 10 5 - 15  CBC   Collection Time: 11/09/18 10:17 PM  Result Value Ref Range   WBC 20.9 (H) 4.0 - 10.5 K/uL   RBC 3.45 (L) 3.87 - 5.11 MIL/uL   Hemoglobin 7.6 (L) 12.0 - 15.0 g/dL   HCT 25.0 (L) 36.0 -  46.0 %   MCV 72.5 (L) 80.0 - 100.0 fL   MCH 22.0 (L) 26.0 - 34.0 pg   MCHC 30.4 30.0 - 36.0 g/dL   RDW 16.9 (H) 11.5 - 15.5 %   Platelets 456 (H) 150 - 400 K/uL   nRBC 0.9 (H) 0.0 - 0.2 %  hCG, quantitative, pregnancy   Collection Time: 11/09/18 10:17 PM  Result Value Ref Range   hCG, Beta Chain, Quant, S 136 (H) <5 mIU/mL  Urinalysis, Routine w reflex microscopic   Collection Time: 11/09/18 10:31 PM  Result Value Ref Range   Color, Urine YELLOW YELLOW   APPearance HAZY (A) CLEAR   Specific Gravity, Urine 1.015 1.005 - 1.030   pH 7.0 5.0 - 8.0   Glucose, UA NEGATIVE NEGATIVE mg/dL   Hgb urine dipstick LARGE (A) NEGATIVE   Bilirubin Urine NEGATIVE NEGATIVE   Ketones, ur NEGATIVE NEGATIVE mg/dL   Protein, ur 100 (A) NEGATIVE mg/dL   Nitrite NEGATIVE NEGATIVE   Leukocytes,Ua MODERATE (A) NEGATIVE   RBC / HPF >50 (H) 0 - 5 RBC/hpf   WBC, UA 11-20 0 - 5 WBC/hpf   Bacteria, UA RARE (A) NONE SEEN   Squamous Epithelial / LPF 0-5 0 - 5   Mucus PRESENT   Blood culture (routine x 2)   Collection Time: 11/10/18 12:53 AM   Specimen: BLOOD RIGHT HAND  Result Value Ref Range   Specimen Description BLOOD RIGHT HAND    Special Requests      BOTTLES DRAWN AEROBIC AND ANAEROBIC Blood Culture results may not be optimal due to an excessive volume of blood received in culture bottles   Culture      NO GROWTH < 12 HOURS Performed at [facility], [address] [place], [place] [address]    Report Status PENDING   Blood culture (routine x 2)   Collection Time: 11/10/18 12:53 AM   Specimen: BLOOD LEFT ARM  Result Value Ref Range   Specimen Description BLOOD LEFT ARM    Special Requests      BOTTLES DRAWN AEROBIC ONLY Blood Culture adequate volume   Culture      NO GROWTH < 12 HOURS Performed at [facility], [place] [address], [place], [place] [address]    Report Status PENDING   Lactic acid, plasma   Collection Time: 11/10/18 12:53 AM  Result Value Ref Range   Lactic Acid, Venous 1.0 0.5 - 1.9 mmol/L  Wet prep, genital   Collection Time: 11/10/18  1:40 AM   Specimen: Cervical/Vaginal swab; Genital  Result Value Ref Range   Yeast Wet Prep HPF POC NONE SEEN NONE SEEN   Trich, Wet Prep NONE SEEN NONE SEEN   Clue Cells Wet Prep HPF POC NONE SEEN NONE SEEN   WBC, Wet Prep HPF POC FEW (A) NONE SEEN   Sperm NONE SEEN   SARS Coronavirus 2   Collection Time: 11/10/18  1:46 AM  Result Value Ref Range   SARS Coronavirus 2 NOT DETECTED NOT DETECTED  Results for orders placed or performed during the hospital encounter of 11/03/18 (from the past 336 hour(s))  CBC with Differential/Platelet   Collection Time: 11/03/18  4:56 PM  Result Value Ref Range   WBC 14.3 (H) 4.0 - 10.5 K/uL   RBC 4.57 3.87 - 5.11 MIL/uL   Hemoglobin 10.3 (L) 12.0 - 15.0 g/dL   HCT 32.7 (L) 36.0 - 46.0 %   MCV 71.6 (L) 80.0 - 100.0  fL   MCH 22.5 (L) 26.0 - 34.0 pg   MCHC 31.5 30.0 - 36.0 g/dL   RDW 17.2 (H) 11.5 - 15.5 %   Platelets 355 150 - 400 K/uL   nRBC 0.2 0.0 - 0.2 %   Neutrophils Relative % 82 %   Neutro Abs 11.7 (H) 1.7 - 7.7 K/uL   Lymphocytes Relative 10 %   Lymphs Abs 1.4 0.7 - 4.0 K/uL   Monocytes Relative 7 %   Monocytes Absolute 1.0 0.1 - 1.0 K/uL   Eosinophils Relative 0 %   Eosinophils Absolute 0.0 0.0 - 0.5 K/uL   Basophils Relative 0 %   Basophils Absolute 0.0 0.0 - 0.1 K/uL   Immature Granulocytes 1 %   Abs  Immature Granulocytes 0.16 (H) 0.00 - 0.07 K/uL  Type and screen   Collection Time: 11/03/18  4:58 PM  Result Value Ref Range   ABO/RH(D) O POS    Antibody Screen NEG    Sample Expiration      11/06/2018,2359 Performed at [facility], [place] [address] [place], [place] [address]   ABO/Rh   Collection Time: 11/03/18  4:58 PM  Result Value Ref Range   ABO/RH(D)      O POS Performed at [facility] [address] [place], [place] [address]   SARS Coronavirus 2 (CEPHEID - Performed in [place] [facility] lab), [facility] Order   Collection Time: 11/03/18  6:47 PM   Specimen: Nasopharyngeal Swab  Result Value Ref Range   SARS Coronavirus 2 NEGATIVE NEGATIVE  CBC   Collection Time: 11/05/18  3:19 PM  Result Value Ref Range   WBC 21.1 (H) 4.0 - 10.5 K/uL   RBC 3.96 3.87 - 5.11 MIL/uL   Hemoglobin 9.1 (L) 12.0 - 15.0 g/dL   HCT 28.4 (L) 36.0 - 46.0 %   MCV 71.7 (L) 80.0 - 100.0 fL   MCH 23.0 (L) 26.0 - 34.0 pg   MCHC 32.0 30.0 - 36.0 g/dL   RDW 17.2 (H) 11.5 - 15.5 %   Platelets 302 150 - 400 K/uL   nRBC 0.1 0.0 - 0.2 %  CBC   Collection Time: 11/06/18  5:47 AM  Result Value Ref Range   WBC 18.3 (H) 4.0 - 10.5 K/uL   RBC 3.53 (L) 3.87 - 5.11 MIL/uL   Hemoglobin 8.0 (L) 12.0 - 15.0 g/dL   HCT 25.6 (L) 36.0 - 46.0 %   MCV 72.5 (L) 80.0 - 100.0 fL   MCH 22.7 (L) 26.0 - 34.0 pg   MCHC 31.3 30.0 - 36.0 g/dL   RDW 17.2 (H) 11.5 - 15.5 %   Platelets 273 150 - 400 K/uL   nRBC 0.1 0.0 - 0.2 %    EKG: No orders found for this or any previous visit.  Imaging Studies: US Ob Limited  Result Date: 10/21/2018 LIMITED SECOND TRIMESTER DAING SONOGRAM MARNI FRANZONI is in the office for second trimester dating sonogram. She is a 43 y.o. year old [id number] with Estimated Date of Delivery: 04/05/19 by today's ultrasound now at  [redacted]w[redacted]d weeks gestation. Thus far the pregnancy has been complicated by fibroids,LPNC,anemia,AMA. GESTATION: [place] PRESENTATION: cephalic FETAL  ACTIVITY:          Heart rate         152          The fetus is active. AMNIOTIC FLUID: The amniotic fluid volume is  normal, SDP : 4.2 cm. PLACENTA LOCALIZATION:  posterior GRADE 0 CERVIX: Appears closed ADNEXA: Ovaries not visualized GESTATIONAL AGE AND  BIOMETRICS: Gestational criteria: Estimated Date of Delivery: 04/05/19 by today's ultrasound now at [redacted]w[redacted]d Previous Scans:0          BIPARIETAL DIAMETER           3.11 cm         15+5 weeks HEAD CIRCUMFERENCE           11.89 cm         15+6 weeks ABDOMINAL CIRCUMFERENCE           10.34 cm         16+2 weeks FEMUR LENGTH           2 cm         15+6 weeks                                                       AVERAGE EGA(BY THIS SCAN):  15+6 weeks                                                 ESTIMATED FETAL WEIGHT:       145  grams ANATOMICAL SURVEY                                                                            COMMENTS     CHOROID PLEXUS yes normal                  NASAL BONE yes normal      FACIAL PROFILE yes normal              STOMACH yes normal      BLADDER yes normal  CORD INSERTION yes normal          ARMS/HANDS yes normal  LEGS/FEET yes normal  GENITALIA yes normal female     SUSPECTED ABNORMALITIES:  multiple fibroids QUALITY OF SCAN: Limited view because of fibroids TECHNICIAN COMMENTS: [place] 15+6 wks IUP,fhr 152 bpm,posterior placenta,ovaries not visualized,multiple large fibroids (#1) anterior 8.3x 7.8 x 6 cm,(#2) anterior fundal 10.3 x 8.8 x 11.2 cm,(#3) fundal 7.2 x 6.7 x 7.8 cm,pedunculated fundal fibroid 21 x 19.6 x 17 cm EDD 04/05/2019 A copy of this report including all images has been saved and backed up to a second source for retrieval if needed. All measures and details of the anatomical scan, placentation, fluid volume and pelvic anatomy are contained in that report. Amber Heide Guile 10/18/2018 5:33 PM Clinical Impression and recommendations: I have reviewed the sonogram results above, combined with the patient's current clinical course,  below are my impressions and any appropriate recommendations for management based on the sonographic findings. Viable 16 week pregnancy IUP [id number] Estimated Date of Delivery: 04/05/19 today's sonogram Extremely large fibroids but not distorting the cervical canal, there is no interference with the lower segment either, the largest fibroid 21  cm is pedunculated Recommend routine care unless otherwise clinically indicated Florian Buff 10/21/2018 9:07 PM   US Transvaginal Non-ob  Result Date: 11/10/2018 CLINICAL DATA:  Right lower quadrant pain. Recent abortion. Fever, elevated white count. EXAM: TRANSABDOMINAL AND TRANSVAGINAL ULTRASOUND OF PELVIS DOPPLER ULTRASOUND OF OVARIES TECHNIQUE: Both transabdominal and transvaginal ultrasound examinations of the pelvis were performed. Transabdominal technique was performed for global imaging of the pelvis including uterus, ovaries, adnexal regions, and pelvic cul-de-sac. It was necessary to proceed with endovaginal exam following the transabdominal exam to visualize the ovaries. Color and duplex Doppler ultrasound was utilized to evaluate blood flow to the ovaries. COMPARISON:  11/03/2018, 10/18/2018 FINDINGS: Uterus Measurements: 17.4 x 13.8 x 15.7 cm = volume: 1953 mL. Multiple large fibroids. The largest fibroid is a right posterior fibroid measuring up to 20 cm. Endometrium Thickness: Not visualized due to fibroids. Right ovary Measurements: Not visualized, likely due to large right fibroid. Left ovary Measurements: 5.1 x 3.6 x 4.4 cm = volume: 41 mL. Normal appearance/no adnexal mass. Pulsed Doppler evaluation of the left ovary demonstrates normal low-resistance arterial and venous waveforms. Other findings No abnormal free fluid. IMPRESSION: Enlarged fibroid uterus. Multiple large fibroids including 20 cm right posterior fundal fibroid. Endometrium cannot be visualized due to multiple fibroids. Electronically Signed   By: Rolm Baptise M.D.   On: 11/10/2018 00:33    US Pelvis Complete  Result Date: 11/10/2018 CLINICAL DATA:  Right lower quadrant pain. Recent abortion. Fever, elevated white count. EXAM: TRANSABDOMINAL AND TRANSVAGINAL ULTRASOUND OF PELVIS DOPPLER ULTRASOUND OF OVARIES TECHNIQUE: Both transabdominal and transvaginal ultrasound examinations of the pelvis were performed. Transabdominal technique was performed for global imaging of the pelvis including uterus, ovaries, adnexal regions, and pelvic cul-de-sac. It was necessary to proceed with endovaginal exam following the transabdominal exam to visualize the ovaries. Color and duplex Doppler ultrasound was utilized to evaluate blood flow to the ovaries. COMPARISON:  11/03/2018, 10/18/2018 FINDINGS: Uterus Measurements: 17.4 x 13.8 x 15.7 cm = volume: 1953 mL. Multiple large fibroids. The largest fibroid is a right posterior fibroid measuring up to 20 cm. Endometrium Thickness: Not visualized due to fibroids. Right ovary Measurements: Not visualized, likely due to large right fibroid. Left ovary Measurements: 5.1 x 3.6 x 4.4 cm = volume: 41 mL. Normal appearance/no adnexal mass. Pulsed Doppler evaluation of the left ovary demonstrates normal low-resistance arterial and venous waveforms. Other findings No abnormal free fluid. IMPRESSION: Enlarged fibroid uterus. Multiple large fibroids including 20 cm right posterior fundal fibroid. Endometrium cannot be visualized due to multiple fibroids. Electronically Signed   By: Rolm Baptise M.D.   On: 11/10/2018 00:33   [place] Intraoperative  Result Date: 11/05/2018 CLINICAL DATA:  Ultrasound was provided for use by the ordering physician, and a technical charge was applied by the performing facility.  No radiologist interpretation/professional services rendered.   Ct Abdomen Pelvis W Contrast  Result Date: 11/10/2018 CLINICAL DATA:  Acute abdominal pain. EXAM: CT ABDOMEN AND PELVIS WITH CONTRAST TECHNIQUE: Multidetector CT imaging of the abdomen and pelvis was  performed using the standard protocol following bolus administration of intravenous contrast. CONTRAST:  113mL OMNIPAQUE IOHEXOL 300 MG/ML  SOLN COMPARISON:  Ultrasound from the same day. FINDINGS: Lower chest: There are bibasilar airspace opacities. The heart size is enlarged. Hepatobiliary: No focal liver abnormality is seen. No gallstones, gallbladder wall thickening, or biliary dilatation. Pancreas: Unremarkable. No pancreatic ductal dilatation or surrounding inflammatory changes. Spleen: There is a small 0.9 cm hypoattenuating nodule in the spleen which  is not fully characterized on this exam but statistically most likely to represent a benign cyst or hemangioma. Adrenals/Urinary Tract: There is no adrenal abnormality. There is some mild pelvic fullness bilaterally. The bladder is unremarkable. Stomach/Bowel: The stomach is unremarkable. There is no evidence of a small-bowel obstruction the colon is unremarkable. The appendix appears to be located in the right lower quadrant and is normal. Vascular/Lymphatic: No significant vascular findings are present. No enlarged abdominal or pelvic lymph nodes. Reproductive: There is a very large abnormal uterus measuring approximately 30 by 21 cm. There are few pockets of gas in what is presumably the lower uterine segment. The endometrium is not well evaluated on this exam. The left ovary appears to measure approximately 5.2 x 3.7 cm. The right ovary is not well evaluated but is likely located in the right lower quadrant. There is a small amount of free fluid in the right lower quadrant adjacent to the presumed location of the right ovary. Other: No abdominal wall hernia or abnormality. No abdominopelvic ascites. Musculoskeletal: No acute or significant osseous findings. IMPRESSION: 1. Very large lobular heterogeneous uterus measuring at least 30 x 21 cm. This is favored to be secondary to multiple large fibroids. Outpatient gynecology follow-up is recommended for this  finding. 2. Multiple small pockets of gas are noted in what is presumably the lower uterine segment. This may be secondary to recent surgical intervention. While there does appear to be some thickening of the endometrial stripe in this location, overall the endometrial stripe is not well evaluated on this exam. Detection of retained products of conception is not possible on this exam. If there is high clinical suspicion for retained products of conception, follow-up with contrast enhanced MRI is recommended. 3. Small mild free fluid in the right lower quadrant adjacent to the right ovary. This may be physiologic or reactive. 4. Bibasilar airspace opacities concerning for developing pneumonia. Postoperative atelectasis seems less likely this far out from surgery. 5. Mild bilateral pelviectasis likely secondary to compression of the distal ureters from the patient's large fibroid uterus. Electronically Signed   By: Constance Holster M.D.   On: 11/10/2018 02:22   [place] Art/ven Flow Abd Pelv Doppler  Result Date: 11/10/2018 CLINICAL DATA:  Right lower quadrant pain. Recent abortion. Fever, elevated white count. EXAM: TRANSABDOMINAL AND TRANSVAGINAL ULTRASOUND OF PELVIS DOPPLER ULTRASOUND OF OVARIES TECHNIQUE: Both transabdominal and transvaginal ultrasound examinations of the pelvis were performed. Transabdominal technique was performed for global imaging of the pelvis including uterus, ovaries, adnexal regions, and pelvic cul-de-sac. It was necessary to proceed with endovaginal exam following the transabdominal exam to visualize the ovaries. Color and duplex Doppler ultrasound was utilized to evaluate blood flow to the ovaries. COMPARISON:  11/03/2018, 10/18/2018 FINDINGS: Uterus Measurements: 17.4 x 13.8 x 15.7 cm = volume: 1953 mL. Multiple large fibroids. The largest fibroid is a right posterior fibroid measuring up to 20 cm. Endometrium Thickness: Not visualized due to fibroids. Right ovary Measurements: Not  visualized, likely due to large right fibroid. Left ovary Measurements: 5.1 x 3.6 x 4.4 cm = volume: 41 mL. Normal appearance/no adnexal mass. Pulsed Doppler evaluation of the left ovary demonstrates normal low-resistance arterial and venous waveforms. Other findings No abnormal free fluid. IMPRESSION: Enlarged fibroid uterus. Multiple large fibroids including 20 cm right posterior fundal fibroid. Endometrium cannot be visualized due to multiple fibroids. Electronically Signed   By: Rolm Baptise M.D.   On: 11/10/2018 00:33   Dg Chest Portable 1 View  Result Date: 11/10/2018  CLINICAL DATA:  Fever. EXAM: PORTABLE CHEST 1 VIEW COMPARISON:  None. FINDINGS: Heart size is enlarged. The patient has bibasilar airspace opacities are better visualized on prior CT. There are few scattered airspace opacities throughout the upper lung zones. There is no pneumothorax. No large pleural effusion. The lung volumes are somewhat low. a IMPRESSION: 1. Previously noted bibasilar airspace opacities are better visualized on prior CT. 2. Additional small scattered opacities in the upper lung zones are nonspecific and may be secondary to the somewhat low lung volumes or could represent additional pulmonary opacities. 3. Cardiomegaly Electronically Signed   By: Constance Holster M.D.   On: 11/10/2018 02:54   [place] Mfm Ob Limited  Result Date: 11/05/2018 ----------------------------------------------------------------------  OBSTETRICS REPORT                    (Corrected Final 11/05/2018 12:08 am) ---------------------------------------------------------------------- Patient Info  ID #:       [id number]                          D.O.B.:  1975-07-16 (43 yrs)  Name:       MERILEE WIBLE Gittings              Visit Date: 11/03/2018 07:21 pm ---------------------------------------------------------------------- Performed By  Performed By:     Dorena Dew     Ref. Address:      [address], [place], [place]  Attending:        Sander Nephew      Secondary Phy.:    [facility] Nursing-                    MD                                                              MAU/Triage  Referred By:      Chancy Milroy        Location[facility]                                        [place] ---------------------------------------------------------------------- Orders   #  Description                          Code         Ordered By   1  [place] MFM OB LIMITED                    29937.16     Arlina Robes  ----------------------------------------------------------------------   #  Order #                    Accession #                 Episode #   1  [id number]                  [id number]                  [id number]  ---------------------------------------------------------------------- Indications   Abnormal fetal heart rate                      O76   [redacted] weeks gestation of pregnancy                Z3A.17   Determine Fetal presentation by ultrasound     Z36.89   Uterine fibroids affecting pregnancy in        O34.12, D25.9   second trimester, antepartum  ---------------------------------------------------------------------- Fetal Evaluation  Num Of Fetuses:          1  Cardiac Activity:        Absent  Presentation:            Breech  Placenta:                Left lateral  P. Cord Insertion:       Not well visualized  Amniotic Fluid  AFI FV:      Oligohydramnios due to ROM ---------------------------------------------------------------------- OB History  Gravidity:    4         Term:   1        Prem:   0        SAB:   0  TOP:          2       Ectopic:  0        Living: 1 ---------------------------------------------------------------------- Gestational Age  Best:          17w 5d     Det. By:  Previous Ultrasound       EDD:   04/08/19                                      (10/21/18) ---------------------------------------------------------------------- Cervix Uterus Adnexa  Cervix  Normal appearance by transabdominal scan. ---------------------------------------------------------------------- Myomas   Site                     L(cm)      W(cm)      D(cm)       Location   Fundus  23.1       21.3       16.8        Pedunculated   Fundus                   10.9       8.2        9.4   Left                     8.1        6.2        5.8  ----------------------------------------------------------------------   Blood Flow                 RI        PI       Comments                                                 In upper abdomen  ---------------------------------------------------------------------- Impression  IUFD-incomplete abortion.  Oligohydramnios  Fibroid uterus ---------------------------------------------------------------------- Recommendations  Clinical correlation recommended. ----------------------------------------------------------------------                    Sander Nephew, MD Electronically Signed Corrected Final Report  11/05/2018 12:08 am ----------------------------------------------------------------------  US Abdomen Limited Ruq  Result Date: 11/10/2018 CLINICAL DATA:  Right upper quadrant pain EXAM: ULTRASOUND ABDOMEN LIMITED RIGHT UPPER QUADRANT COMPARISON:  None. FINDINGS: Gallbladder: No gallstones or wall thickening visualized. No sonographic Murphy sign noted by sonographer. Common bile duct: Diameter: Normal caliber, 3 mm Liver: No focal lesion identified. Within normal limits in parenchymal echogenicity. Portal vein is patent on color Doppler imaging with normal direction of blood flow towards the liver. Enlarged fibroid uterus extends into the upper abdomen. IMPRESSION: No acute right upper quadrant process. Enlarged fibroid uterus extends into the upper abdomen. Electronically Signed    By: Rolm Baptise M.D.   On: 11/10/2018 00:38      Assessment: Post abortal endometritis Extremely large fibroids  Plan: Zosyn as single agent coverage , most likely 72 hours or so of in house therapy will be needed followed by home oral for 10 days or so Ultimately will need TAH due to her fibroids, pt aware  Florian Buff 11/10/2018 9:05 AM

## 2018-11-10 NOTE — ED Notes (Signed)
Delay in lab draw,  Pt is not in room at this time.

## 2018-11-10 NOTE — ED Notes (Signed)
Taken to CT.

## 2018-11-11 LAB — CBC WITH DIFFERENTIAL/PLATELET
Abs Immature Granulocytes: 0.97 10*3/uL — ABNORMAL HIGH (ref 0.00–0.07)
Basophils Absolute: 0 10*3/uL (ref 0.0–0.1)
Basophils Relative: 0 %
Eosinophils Absolute: 0.2 10*3/uL (ref 0.0–0.5)
Eosinophils Relative: 1 %
HCT: 24.2 % — ABNORMAL LOW (ref 36.0–46.0)
Hemoglobin: 7.4 g/dL — ABNORMAL LOW (ref 12.0–15.0)
Immature Granulocytes: 5 %
Lymphocytes Relative: 8 %
Lymphs Abs: 1.7 10*3/uL (ref 0.7–4.0)
MCH: 22 pg — ABNORMAL LOW (ref 26.0–34.0)
MCHC: 30.6 g/dL (ref 30.0–36.0)
MCV: 71.8 fL — ABNORMAL LOW (ref 80.0–100.0)
Monocytes Absolute: 1.7 10*3/uL — ABNORMAL HIGH (ref 0.1–1.0)
Monocytes Relative: 8 %
Neutro Abs: 16 10*3/uL — ABNORMAL HIGH (ref 1.7–7.7)
Neutrophils Relative %: 78 %
Platelets: 526 10*3/uL — ABNORMAL HIGH (ref 150–400)
RBC: 3.37 MIL/uL — ABNORMAL LOW (ref 3.87–5.11)
RDW: 16.9 % — ABNORMAL HIGH (ref 11.5–15.5)
WBC: 20.5 10*3/uL — ABNORMAL HIGH (ref 4.0–10.5)
nRBC: 1.1 % — ABNORMAL HIGH (ref 0.0–0.2)

## 2018-11-11 MED ORDER — ACETAMINOPHEN 500 MG PO TABS
500.0000 mg | ORAL_TABLET | Freq: Four times a day (QID) | ORAL | Status: DC | PRN
  Administered 2018-11-12: 500 mg via ORAL
  Filled 2018-11-11: qty 1

## 2018-11-11 MED ORDER — SODIUM CHLORIDE 0.9 % IV SOLN
510.0000 mg | Freq: Once | INTRAVENOUS | Status: AC
  Administered 2018-11-11: 510 mg via INTRAVENOUS
  Filled 2018-11-11: qty 17

## 2018-11-11 MED ORDER — HYDROCODONE-ACETAMINOPHEN 5-325 MG PO TABS
1.0000 | ORAL_TABLET | Freq: Four times a day (QID) | ORAL | Status: DC | PRN
  Administered 2018-11-11 – 2018-11-12 (×4): 2 via ORAL
  Filled 2018-11-11 (×5): qty 2

## 2018-11-11 NOTE — Progress Notes (Signed)
Subjective: POD #8 Uterine evacuation for incomplete termination Patient reports feels better, wants to go home.    Objective: I have reviewed patient's vital signs, intake and output, medications, labs, pathology and radiology results.  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

## 2018-11-12 ENCOUNTER — Encounter: Payer: Medicare Other | Admitting: Obstetrics and Gynecology

## 2018-11-12 LAB — GC/CHLAMYDIA PROBE AMP (~~LOC~~) NOT AT ARMC
Chlamydia: NEGATIVE
Neisseria Gonorrhea: NEGATIVE

## 2018-11-12 MED ORDER — KETOROLAC TROMETHAMINE 10 MG PO TABS: 10.0000 mg | ORAL_TABLET | Freq: Three times a day (TID) | ORAL | 0 refills | Status: DC | PRN

## 2018-11-12 MED ORDER — HYDROCODONE-ACETAMINOPHEN 5-325 MG PO TABS: 1.0000 | ORAL_TABLET | Freq: Four times a day (QID) | ORAL | 0 refills | Status: DC | PRN

## 2018-11-12 MED ORDER — MEDROXYPROGESTERONE ACETATE 150 MG/ML IM SUSP
150.0000 mg | Freq: Once | INTRAMUSCULAR | Status: AC
  Administered 2018-11-12: 150 mg via INTRAMUSCULAR
  Filled 2018-11-12: qty 1

## 2018-11-12 MED ORDER — LEVOFLOXACIN IN D5W 750 MG/150ML IV SOLN
750.0000 mg | Freq: Once | INTRAVENOUS | Status: AC
  Administered 2018-11-12: 750 mg via INTRAVENOUS
  Filled 2018-11-12: qty 150

## 2018-11-12 MED ORDER — CIPROFLOXACIN HCL 500 MG PO TABS: 500.0000 mg | ORAL_TABLET | Freq: Two times a day (BID) | ORAL | 0 refills | Status: DC

## 2018-11-12 MED ORDER — METRONIDAZOLE 500 MG PO TABS: 500.0000 mg | ORAL_TABLET | Freq: Two times a day (BID) | ORAL | 0 refills | Status: DC

## 2018-11-12 NOTE — Discharge Instructions (Signed)

## 2018-11-12 NOTE — Discharge Summary (Signed)
Physician Discharge Summary  Patient ID: LARRAINE ARGO MRN: [id number] DOB/AGE: 44-May-1977 44 y.o.  Admit date: 11/09/2018 Discharge date: 11/12/2018  Admission Diagnoses: Post abortal endometritis Degenerative changes, fibroids  Discharge Diagnoses:  Active Problems:   Endometritis   Discharged Condition: good  Hospital Course: placed on zosyn with good fever curve response, pain continued due to degenerative fibroids  Consults:   Significant Diagnostic Studies: labs:   Treatments: antibiotics: Zosyn  Discharge Exam: Blood pressure 132/83, pulse 80, temperature 97.7 F (36.5 C), temperature source Oral, resp. rate 17, height 5\' 3"  (1.6 m), weight 103.5 kg, SpO2 97 %. General appearance: alert, cooperative and mild distress GI: 34 weeks size fibroids tender over them consistent with degenerative changes  Disposition: Discharge disposition: 01-Home or Self Care       Discharge Instructions    Call MD for:  persistant nausea and vomiting   Complete by: As directed    Call MD for:  severe uncontrolled pain   Complete by: As directed    Call MD for:  temperature >100.4   Complete by: As directed    Diet - low sodium heart healthy   Complete by: As directed    Driving Restrictions   Complete by: As directed    No restricitons   Increase activity slowly   Complete by: As directed    Lifting restrictions   Complete by: As directed    No restrictions   Sexual Activity Restrictions   Complete by: As directed    No sex     Allergies as of 11/12/2018   No Known Allergies     Medication List    STOP taking these medications   ibuprofen 800 MG tablet Commonly known as: ADVIL     TAKE these medications   ciprofloxacin 500 MG tablet Commonly known as: Cipro Take 1 tablet (500 mg total) by mouth 2 (two) times daily.   docusate sodium 100 MG capsule Commonly known as: COLACE Take 1 capsule (100 mg total) by mouth 2 (two) times daily.    HYDROcodone-acetaminophen 5-325 MG tablet Commonly known as: NORCO/VICODIN Take 1-2 tablets by mouth every 6 (six) hours as needed for moderate pain or severe pain.   ketorolac 10 MG tablet Commonly known as: TORADOL Take 1 tablet (10 mg total) by mouth every 8 (eight) hours as needed.   metroNIDAZOLE 500 MG tablet Commonly known as: Flagyl Take 1 tablet (500 mg total) by mouth 2 (two) times daily.   oxyCODONE 5 MG immediate release tablet Commonly known as: Oxy IR/ROXICODONE Take 1 tablet (5 mg total) by mouth every 4 (four) hours as needed for severe pain or breakthrough pain.      Follow-up Information    Florian Buff, MD Follow up on 11/16/2018.   Specialties: Obstetrics and Gynecology, Radiology Why: follow up Contact information: [place] [id number] [phone]           Signed: Florian Buff 11/12/2018, 7:48 AM

## 2018-11-12 NOTE — Discharge Planning (Signed)
Patient discharged home in stable condition. Verbalizes understanding of all discharge instructions, including home medications and follow up appointments. 

## 2018-11-15 ENCOUNTER — Telehealth: Payer: Self-pay | Admitting: Obstetrics & Gynecology

## 2018-11-15 LAB — CULTURE, BLOOD (ROUTINE X 2)
Culture: NO GROWTH
Culture: NO GROWTH
Special Requests: ADEQUATE

## 2018-11-15 NOTE — Telephone Encounter (Signed)
Done, per Dr. Elonda Husky, scheduled patient for 10am.

## 2018-11-15 NOTE — Telephone Encounter (Signed)
Patient called, stated she was just in the ER recently and that Dr. Elonda Husky told her to make sure she gets in here on 11/16/18.  He is booked, please advise.  [phone]

## 2018-11-16 ENCOUNTER — Encounter: Payer: Self-pay | Admitting: Obstetrics & Gynecology

## 2018-11-16 ENCOUNTER — Ambulatory Visit (INDEPENDENT_AMBULATORY_CARE_PROVIDER_SITE_OTHER): Payer: Medicare Other | Admitting: Obstetrics & Gynecology

## 2018-11-16 ENCOUNTER — Other Ambulatory Visit: Payer: Self-pay

## 2018-11-16 VITALS — BP 125/64 | HR 96 | Ht 63.0 in | Wt 227.5 lb

## 2018-11-16 DIAGNOSIS — D219 Benign neoplasm of connective and other soft tissue, unspecified: Secondary | ICD-10-CM

## 2018-11-16 DIAGNOSIS — D500 Iron deficiency anemia secondary to blood loss (chronic): Secondary | ICD-10-CM | POA: Diagnosis not present

## 2018-11-16 MED ORDER — HYDROCODONE-ACETAMINOPHEN 5-325 MG PO TABS: 1.0000 | ORAL_TABLET | Freq: Four times a day (QID) | ORAL | 0 refills | Status: DC | PRN

## 2018-11-16 NOTE — Progress Notes (Signed)
Preoperative History and Physical  MIKIYAH Black is a 43 y.o. [phone] with No LMP recorded (lmp unknown). Patient is pregnant. admitted for a abdominal hysterectomy.  Has 34 week size fibroid uterus with increasing abdominal pain due to degeneration. Admitted for abdominal hysterectomy for management  PMH:    Past Medical History:  Diagnosis Date  . Acne   . Anemia 11/25/2013  . BV (bacterial vaginosis)   . Fibroids    uterine  . History of trichomoniasis   . Menorrhagia 11/08/2013  . Obesity   . Vaginal discharge 01/07/2015  . Vaginal odor 11/08/2013    PSH:     Past Surgical History:  Procedure Laterality Date  . DILATION AND EVACUATION N/A 11/05/2018   Procedure: DILATATION AND EVACUATION;  Surgeon: Sloan Leiter, MD;  Location: [facility] LD ORS;  Service: Gynecology;  Laterality: N/A;  . NO PAST SURGERIES      POb/GynH:      OB History    Gravida  4   Para  2   Term  1   Preterm      AB  2   Living  1     SAB      TAB  2   Ectopic      Multiple  0   Live Births  1           SH:   Social History   Tobacco Use  . Smoking status: Never Smoker  . Smokeless tobacco: Never Used  Substance Use Topics  . Alcohol use: No  . Drug use: No    FH:    Family History  Problem Relation Age of Onset  . Hypertension Mother   . Diabetes Father   . Cancer Maternal Grandfather   . Asthma Son   . Cancer Maternal Aunt      Allergies: No Known Allergies  Medications:       Current Outpatient Medications:  .  ciprofloxacin (CIPRO) 500 MG tablet, Take 1 tablet (500 mg total) by mouth 2 (two) times daily., Disp: 14 tablet, Rfl: 0 .  HYDROcodone-acetaminophen (NORCO/VICODIN) 5-325 MG tablet, Take 1-2 tablets by mouth every 6 (six) hours as needed for moderate pain or severe pain., Disp: 30 tablet, Rfl: 0 .  metroNIDAZOLE (FLAGYL) 500 MG tablet, Take 1 tablet (500 mg total) by mouth 2 (two) times daily., Disp: 14 tablet, Rfl: 0  Review of Systems:   Review of  Systems  Constitutional: Negative for fever, chills, weight loss, malaise/fatigue and diaphoresis.  HENT: Negative for hearing loss, ear pain, nosebleeds, congestion, sore throat, neck pain, tinnitus and ear discharge.   Eyes: Negative for blurred vision, double vision, photophobia, pain, discharge and redness.  Respiratory: Negative for cough, hemoptysis, sputum production, shortness of breath, wheezing and stridor.   Cardiovascular: Negative for chest pain, palpitations, orthopnea, claudication, leg swelling and PND.  Gastrointestinal: Positive for abdominal pain. Negative for heartburn, nausea, vomiting, diarrhea, constipation, blood in stool and melena.  Genitourinary: Negative for dysuria, urgency, frequency, hematuria and flank pain.  Musculoskeletal: Negative for myalgias, back pain, joint pain and falls.  Skin: Negative for itching and rash.  Neurological: Negative for dizziness, tingling, tremors, sensory change, speech change, focal weakness, seizures, loss of consciousness, weakness and headaches.  Endo/Heme/Allergies: Negative for environmental allergies and polydipsia. Does not bruise/bleed easily.  Psychiatric/Behavioral: Negative for depression, suicidal ideas, hallucinations, memory loss and substance abuse. The patient is not nervous/anxious and does not have insomnia.  PHYSICAL EXAM:  Blood pressure 125/64, pulse 96, height 5\' 3"  (1.6 m), weight 227 lb 8 oz (103.2 kg).    Vitals reviewed. Constitutional: She is oriented to person, place, and time. She appears well-developed and well-nourished.  HENT:  Head: Normocephalic and atraumatic.  Right Ear: External ear normal.  Left Ear: External ear normal.  Nose: Nose normal.  Mouth/Throat: Oropharynx is clear and moist.  Eyes: Conjunctivae and EOM are normal. Pupils are equal, round, and reactive to light. Right eye exhibits no discharge. Left eye exhibits no discharge. No scleral icterus.  Neck: Normal range of motion.  Neck supple. No tracheal deviation present. No thyromegaly present.  Cardiovascular: Normal rate, regular rhythm, normal heart sounds and intact distal pulses.  Exam reveals no gallop and no friction rub.   No murmur heard. Respiratory: Effort normal and breath sounds normal. No respiratory distress. She has no wheezes. She has no rales. She exhibits no tenderness.  GI: Soft. Bowel sounds are normal. She exhibits no distension and no mass. There is tenderness. There is no rebound and no guarding.  Genitourinary:       Vulva is normal without lesions Vagina is pink moist without discharge Cervix normal in appearance and pap is normal Uterus is 34 weeks size, largest is 23 cm Adnexa is negative with normal sized ovaries by sonogram  Musculoskeletal: Normal range of motion. She exhibits no edema and no tenderness.  Neurological: She is alert and oriented to person, place, and time. She has normal reflexes. She displays normal reflexes. No cranial nerve deficit. She exhibits normal muscle tone. Coordination normal.  Skin: Skin is warm and dry. No rash noted. No erythema. No pallor.  Psychiatric: She has a normal mood and affect. Her behavior is normal. Judgment and thought content normal.    Labs: Results for orders placed or performed during the hospital encounter of 11/09/18 (from the past 336 hour(s))  GC/Chlamydia probe amp ([place])not at [facility]   Collection Time: 11/09/18 12:00 AM  Result Value Ref Range   Chlamydia Negative    Neisseria gonorrhea Negative   Lipase, blood   Collection Time: 11/09/18 10:17 PM  Result Value Ref Range   Lipase 21 11 - 51 U/L  Comprehensive metabolic panel   Collection Time: 11/09/18 10:17 PM  Result Value Ref Range   Sodium 137 135 - 145 mmol/L   Potassium 3.0 (L) 3.5 - 5.1 mmol/L   Chloride 102 98 - 111 mmol/L   CO2 25 22 - 32 mmol/L   Glucose, Bld 111 (H) 70 - 99 mg/dL   BUN 5 (L) 6 - 20 mg/dL   Creatinine, Ser 0.48 0.44 - 1.00 mg/dL    Calcium 8.7 (L) 8.9 - 10.3 mg/dL   Total Protein 6.4 (L) 6.5 - 8.1 g/dL   Albumin 2.6 (L) 3.5 - 5.0 g/dL   AST 31 15 - 41 U/L   ALT 35 0 - 44 U/L   Alkaline Phosphatase 71 38 - 126 U/L   Total Bilirubin 0.1 (L) 0.3 - 1.2 mg/dL   GFR calc non Af Amer >60 >60 mL/min   GFR calc Af Amer >60 >60 mL/min   Anion gap 10 5 - 15  CBC   Collection Time: 11/09/18 10:17 PM  Result Value Ref Range   WBC 20.9 (H) 4.0 - 10.5 K/uL   RBC 3.45 (L) 3.87 - 5.11 MIL/uL   Hemoglobin 7.6 (L) 12.0 - 15.0 g/dL   HCT 25.0 (L) 36.0 - 46.0 %  MCV 72.5 (L) 80.0 - 100.0 fL   MCH 22.0 (L) 26.0 - 34.0 pg   MCHC 30.4 30.0 - 36.0 g/dL   RDW 16.9 (H) 11.5 - 15.5 %   Platelets 456 (H) 150 - 400 K/uL   nRBC 0.9 (H) 0.0 - 0.2 %  hCG, quantitative, pregnancy   Collection Time: 11/09/18 10:17 PM  Result Value Ref Range   hCG, Beta Chain, Quant, S 136 (H) <5 mIU/mL  Urinalysis, Routine w reflex microscopic   Collection Time: 11/09/18 10:31 PM  Result Value Ref Range   Color, Urine YELLOW YELLOW   APPearance HAZY (A) CLEAR   Specific Gravity, Urine 1.015 1.005 - 1.030   pH 7.0 5.0 - 8.0   Glucose, UA NEGATIVE NEGATIVE mg/dL   Hgb urine dipstick LARGE (A) NEGATIVE   Bilirubin Urine NEGATIVE NEGATIVE   Ketones, ur NEGATIVE NEGATIVE mg/dL   Protein, ur 100 (A) NEGATIVE mg/dL   Nitrite NEGATIVE NEGATIVE   Leukocytes,Ua MODERATE (A) NEGATIVE   RBC / HPF >50 (H) 0 - 5 RBC/hpf   WBC, UA 11-20 0 - 5 WBC/hpf   Bacteria, UA RARE (A) NONE SEEN   Squamous Epithelial / LPF 0-5 0 - 5   Mucus PRESENT   Blood culture (routine x 2)   Collection Time: 11/10/18 12:53 AM   Specimen: BLOOD RIGHT HAND  Result Value Ref Range   Specimen Description BLOOD RIGHT HAND    Special Requests      BOTTLES DRAWN AEROBIC AND ANAEROBIC Blood Culture results may not be optimal due to an excessive volume of blood received in culture bottles   Culture      NO GROWTH 5 DAYS Performed at [facility], [address] [place],  [place] [address]    Report Status 11/15/2018 FINAL   Blood culture (routine x 2)   Collection Time: 11/10/18 12:53 AM   Specimen: BLOOD LEFT ARM  Result Value Ref Range   Specimen Description BLOOD LEFT ARM    Special Requests      BOTTLES DRAWN AEROBIC ONLY Blood Culture adequate volume   Culture      NO GROWTH 5 DAYS Performed at [facility], [place] [address], [place], [place] [address]    Report Status 11/15/2018 FINAL   Lactic acid, plasma   Collection Time: 11/10/18 12:53 AM  Result Value Ref Range   Lactic Acid, Venous 1.0 0.5 - 1.9 mmol/L  Wet prep, genital   Collection Time: 11/10/18  1:40 AM   Specimen: Cervical/Vaginal swab; Genital  Result Value Ref Range   Yeast Wet Prep HPF POC NONE SEEN NONE SEEN   Trich, Wet Prep NONE SEEN NONE SEEN   Clue Cells Wet Prep HPF POC NONE SEEN NONE SEEN   WBC, Wet Prep HPF POC FEW (A) NONE SEEN   Sperm NONE SEEN   SARS Coronavirus 2   Collection Time: 11/10/18  1:46 AM  Result Value Ref Range   SARS Coronavirus 2 NOT DETECTED NOT DETECTED  CBC   Collection Time: 11/10/18  8:11 AM  Result Value Ref Range   WBC 20.8 (H) 4.0 - 10.5 K/uL   RBC 3.35 (L) 3.87 - 5.11 MIL/uL   Hemoglobin 7.4 (L) 12.0 - 15.0 g/dL   HCT 24.3 (L) 36.0 - 46.0 %   MCV 72.5 (L) 80.0 - 100.0 fL   MCH 22.1 (L) 26.0 - 34.0 pg   MCHC 30.5 30.0 - 36.0 g/dL   RDW 17.0 (H)  11.5 - 15.5 %   Platelets 427 (H) 150 - 400 K/uL   nRBC 0.7 (H) 0.0 - 0.2 %  CBC with Differential/Platelet   Collection Time: 11/11/18  8:29 AM  Result Value Ref Range   WBC 20.5 (H) 4.0 - 10.5 K/uL   RBC 3.37 (L) 3.87 - 5.11 MIL/uL   Hemoglobin 7.4 (L) 12.0 - 15.0 g/dL   HCT 24.2 (L) 36.0 - 46.0 %   MCV 71.8 (L) 80.0 - 100.0 fL   MCH 22.0 (L) 26.0 - 34.0 pg   MCHC 30.6 30.0 - 36.0 g/dL   RDW 16.9 (H) 11.5 - 15.5 %   Platelets 526 (H) 150 - 400 K/uL   nRBC 1.1 (H) 0.0 - 0.2 %   Neutrophils Relative % 78 %   Neutro Abs 16.0 (H) 1.7 - 7.7 K/uL   Lymphocytes Relative 8 %   Lymphs Abs  1.7 0.7 - 4.0 K/uL   Monocytes Relative 8 %   Monocytes Absolute 1.7 (H) 0.1 - 1.0 K/uL   Eosinophils Relative 1 %   Eosinophils Absolute 0.2 0.0 - 0.5 K/uL   Basophils Relative 0 %   Basophils Absolute 0.0 0.0 - 0.1 K/uL   Immature Granulocytes 5 %   Abs Immature Granulocytes 0.97 (H) 0.00 - 0.07 K/uL  Results for orders placed or performed during the hospital encounter of 11/03/18 (from the past 336 hour(s))  CBC with Differential/Platelet   Collection Time: 11/03/18  4:56 PM  Result Value Ref Range   WBC 14.3 (H) 4.0 - 10.5 K/uL   RBC 4.57 3.87 - 5.11 MIL/uL   Hemoglobin 10.3 (L) 12.0 - 15.0 g/dL   HCT 32.7 (L) 36.0 - 46.0 %   MCV 71.6 (L) 80.0 - 100.0 fL   MCH 22.5 (L) 26.0 - 34.0 pg   MCHC 31.5 30.0 - 36.0 g/dL   RDW 17.2 (H) 11.5 - 15.5 %   Platelets 355 150 - 400 K/uL   nRBC 0.2 0.0 - 0.2 %   Neutrophils Relative % 82 %   Neutro Abs 11.7 (H) 1.7 - 7.7 K/uL   Lymphocytes Relative 10 %   Lymphs Abs 1.4 0.7 - 4.0 K/uL   Monocytes Relative 7 %   Monocytes Absolute 1.0 0.1 - 1.0 K/uL   Eosinophils Relative 0 %   Eosinophils Absolute 0.0 0.0 - 0.5 K/uL   Basophils Relative 0 %   Basophils Absolute 0.0 0.0 - 0.1 K/uL   Immature Granulocytes 1 %   Abs Immature Granulocytes 0.16 (H) 0.00 - 0.07 K/uL  Type and screen   Collection Time: 11/03/18  4:58 PM  Result Value Ref Range   ABO/RH(D) O POS    Antibody Screen NEG    Sample Expiration      11/06/2018,2359 Performed at [facility], [address] [place], [place] [address]   ABO/Rh   Collection Time: 11/03/18  4:58 PM  Result Value Ref Range   ABO/RH(D)      O POS Performed at [facility] [address], [place], [place] [address]   SARS Coronavirus 2 (CEPHEID - Performed in [facility] hospital lab), [facility] Order   Collection Time: 11/03/18  6:47 PM   Specimen: Nasopharyngeal Swab  Result Value Ref Range   SARS Coronavirus 2 NEGATIVE NEGATIVE  CBC   Collection Time: 11/05/18  3:19 PM  Result  Value Ref Range   WBC 21.1 (H) 4.0 - 10.5 K/uL   RBC 3.96 3.87 -  5.11 MIL/uL   Hemoglobin 9.1 (L) 12.0 - 15.0 g/dL   HCT 28.4 (L) 36.0 - 46.0 %   MCV 71.7 (L) 80.0 - 100.0 fL   MCH 23.0 (L) 26.0 - 34.0 pg   MCHC 32.0 30.0 - 36.0 g/dL   RDW 17.2 (H) 11.5 - 15.5 %   Platelets 302 150 - 400 K/uL   nRBC 0.1 0.0 - 0.2 %  CBC   Collection Time: 11/06/18  5:47 AM  Result Value Ref Range   WBC 18.3 (H) 4.0 - 10.5 K/uL   RBC 3.53 (L) 3.87 - 5.11 MIL/uL   Hemoglobin 8.0 (L) 12.0 - 15.0 g/dL   HCT 25.6 (L) 36.0 - 46.0 %   MCV 72.5 (L) 80.0 - 100.0 fL   MCH 22.7 (L) 26.0 - 34.0 pg   MCHC 31.3 30.0 - 36.0 g/dL   RDW 17.2 (H) 11.5 - 15.5 %   Platelets 273 150 - 400 K/uL   nRBC 0.1 0.0 - 0.2 %    EKG: No orders found for this or any previous visit.  Imaging Studies: US Ob Limited  Result Date: 10/21/2018 LIMITED SECOND TRIMESTER DAING SONOGRAM MCKELL RIECKE is in the office for second trimester dating sonogram. She is a 43 y.o. year old [id number] with Estimated Date of Delivery: 04/05/19 by today's ultrasound now at  [redacted]w[redacted]d weeks gestation. Thus far the pregnancy has been complicated by fibroids,LPNC,anemia,AMA. GESTATION: [place] PRESENTATION: cephalic FETAL ACTIVITY:          Heart rate         152          The fetus is active. AMNIOTIC FLUID: The amniotic fluid volume is  normal, SDP : 4.2 cm. PLACENTA LOCALIZATION:  posterior GRADE 0 CERVIX: Appears closed ADNEXA: Ovaries not visualized GESTATIONAL AGE AND  BIOMETRICS: Gestational criteria: Estimated Date of Delivery: 04/05/19 by today's ultrasound now at [redacted]w[redacted]d Previous Scans:0          BIPARIETAL DIAMETER           3.11 cm         15+5 weeks HEAD CIRCUMFERENCE           11.89 cm         15+6 weeks ABDOMINAL CIRCUMFERENCE           10.34 cm         16+2 weeks FEMUR LENGTH           2 cm         15+6 weeks                                                       AVERAGE EGA(BY THIS SCAN):  15+6 weeks                                                  ESTIMATED FETAL WEIGHT:       145  grams ANATOMICAL SURVEY  COMMENTS     CHOROID PLEXUS yes normal                  NASAL BONE yes normal      FACIAL PROFILE yes normal              STOMACH yes normal      BLADDER yes normal  CORD INSERTION yes normal          ARMS/HANDS yes normal  LEGS/FEET yes normal  GENITALIA yes normal female     SUSPECTED ABNORMALITIES:  multiple fibroids QUALITY OF SCAN: Limited view because of fibroids TECHNICIAN COMMENTS: [place] 15+6 wks IUP,fhr 152 bpm,posterior placenta,ovaries not visualized,multiple large fibroids (#1) anterior 8.3x 7.8 x 6 cm,(#2) anterior fundal 10.3 x 8.8 x 11.2 cm,(#3) fundal 7.2 x 6.7 x 7.8 cm,pedunculated fundal fibroid 21 x 19.6 x 17 cm EDD 04/05/2019 A copy of this report including all images has been saved and backed up to a second source for retrieval if needed. All measures and details of the anatomical scan, placentation, fluid volume and pelvic anatomy are contained in that report. Amber Heide Guile 10/18/2018 5:33 PM Clinical Impression and recommendations: I have reviewed the sonogram results above, combined with the patient's current clinical course, below are my impressions and any appropriate recommendations for management based on the sonographic findings. Viable 16 week pregnancy IUP [id number] Estimated Date of Delivery: 04/05/19 today's sonogram Extremely large fibroids but not distorting the cervical canal, there is no interference with the lower segment either, the largest fibroid 21 cm is pedunculated Recommend routine care unless otherwise clinically indicated Florian Buff 10/21/2018 9:07 PM   US Transvaginal Non-ob  Result Date: 11/10/2018 CLINICAL DATA:  Right lower quadrant pain. Recent abortion. Fever, elevated white count. EXAM: TRANSABDOMINAL AND TRANSVAGINAL ULTRASOUND OF PELVIS DOPPLER ULTRASOUND OF OVARIES TECHNIQUE: Both transabdominal and transvaginal ultrasound  examinations of the pelvis were performed. Transabdominal technique was performed for global imaging of the pelvis including uterus, ovaries, adnexal regions, and pelvic cul-de-sac. It was necessary to proceed with endovaginal exam following the transabdominal exam to visualize the ovaries. Color and duplex Doppler ultrasound was utilized to evaluate blood flow to the ovaries. COMPARISON:  11/03/2018, 10/18/2018 FINDINGS: Uterus Measurements: 17.4 x 13.8 x 15.7 cm = volume: 1953 mL. Multiple large fibroids. The largest fibroid is a right posterior fibroid measuring up to 20 cm. Endometrium Thickness: Not visualized due to fibroids. Right ovary Measurements: Not visualized, likely due to large right fibroid. Left ovary Measurements: 5.1 x 3.6 x 4.4 cm = volume: 41 mL. Normal appearance/no adnexal mass. Pulsed Doppler evaluation of the left ovary demonstrates normal low-resistance arterial and venous waveforms. Other findings No abnormal free fluid. IMPRESSION: Enlarged fibroid uterus. Multiple large fibroids including 20 cm right posterior fundal fibroid. Endometrium cannot be visualized due to multiple fibroids. Electronically Signed   By: Rolm Baptise M.D.   On: 11/10/2018 00:33   US Pelvis Complete  Result Date: 11/10/2018 CLINICAL DATA:  Right lower quadrant pain. Recent abortion. Fever, elevated white count. EXAM: TRANSABDOMINAL AND TRANSVAGINAL ULTRASOUND OF PELVIS DOPPLER ULTRASOUND OF OVARIES TECHNIQUE: Both transabdominal and transvaginal ultrasound examinations of the pelvis were performed. Transabdominal technique was performed for global imaging of the pelvis including uterus, ovaries, adnexal regions, and pelvic cul-de-sac. It was necessary to proceed with endovaginal exam following the transabdominal exam to visualize the ovaries. Color and duplex Doppler ultrasound was utilized to evaluate blood flow to the ovaries. COMPARISON:  11/03/2018, 10/18/2018 FINDINGS: Uterus Measurements:  17.4 x 13.8 x  15.7 cm = volume: 1953 mL. Multiple large fibroids. The largest fibroid is a right posterior fibroid measuring up to 20 cm. Endometrium Thickness: Not visualized due to fibroids. Right ovary Measurements: Not visualized, likely due to large right fibroid. Left ovary Measurements: 5.1 x 3.6 x 4.4 cm = volume: 41 mL. Normal appearance/no adnexal mass. Pulsed Doppler evaluation of the left ovary demonstrates normal low-resistance arterial and venous waveforms. Other findings No abnormal free fluid. IMPRESSION: Enlarged fibroid uterus. Multiple large fibroids including 20 cm right posterior fundal fibroid. Endometrium cannot be visualized due to multiple fibroids. Electronically Signed   By: Rolm Baptise M.D.   On: 11/10/2018 00:33   [place] Intraoperative  Result Date: 11/05/2018 CLINICAL DATA:  Ultrasound was provided for use by the ordering physician, and a technical charge was applied by the performing facility.  No radiologist interpretation/professional services rendered.   Ct Abdomen Pelvis W Contrast  Result Date: 11/10/2018 CLINICAL DATA:  Acute abdominal pain. EXAM: CT ABDOMEN AND PELVIS WITH CONTRAST TECHNIQUE: Multidetector CT imaging of the abdomen and pelvis was performed using the standard protocol following bolus administration of intravenous contrast. CONTRAST:  134mL OMNIPAQUE IOHEXOL 300 MG/ML  SOLN COMPARISON:  Ultrasound from the same day. FINDINGS: Lower chest: There are bibasilar airspace opacities. The heart size is enlarged. Hepatobiliary: No focal liver abnormality is seen. No gallstones, gallbladder wall thickening, or biliary dilatation. Pancreas: Unremarkable. No pancreatic ductal dilatation or surrounding inflammatory changes. Spleen: There is a small 0.9 cm hypoattenuating nodule in the spleen which is not fully characterized on this exam but statistically most likely to represent a benign cyst or hemangioma. Adrenals/Urinary Tract: There is no adrenal abnormality. There is some mild  pelvic fullness bilaterally. The bladder is unremarkable. Stomach/Bowel: The stomach is unremarkable. There is no evidence of a small-bowel obstruction the colon is unremarkable. The appendix appears to be located in the right lower quadrant and is normal. Vascular/Lymphatic: No significant vascular findings are present. No enlarged abdominal or pelvic lymph nodes. Reproductive: There is a very large abnormal uterus measuring approximately 30 by 21 cm. There are few pockets of gas in what is presumably the lower uterine segment. The endometrium is not well evaluated on this exam. The left ovary appears to measure approximately 5.2 x 3.7 cm. The right ovary is not well evaluated but is likely located in the right lower quadrant. There is a small amount of free fluid in the right lower quadrant adjacent to the presumed location of the right ovary. Other: No abdominal wall hernia or abnormality. No abdominopelvic ascites. Musculoskeletal: No acute or significant osseous findings. IMPRESSION: 1. Very large lobular heterogeneous uterus measuring at least 30 x 21 cm. This is favored to be secondary to multiple large fibroids. Outpatient gynecology follow-up is recommended for this finding. 2. Multiple small pockets of gas are noted in what is presumably the lower uterine segment. This may be secondary to recent surgical intervention. While there does appear to be some thickening of the endometrial stripe in this location, overall the endometrial stripe is not well evaluated on this exam. Detection of retained products of conception is not possible on this exam. If there is high clinical suspicion for retained products of conception, follow-up with contrast enhanced MRI is recommended. 3. Small mild free fluid in the right lower quadrant adjacent to the right ovary. This may be physiologic or reactive. 4. Bibasilar airspace opacities concerning for developing pneumonia. Postoperative atelectasis seems less likely this far  out from surgery. 5. Mild bilateral pelviectasis likely secondary to compression of the distal ureters from the patient's large fibroid uterus. Electronically Signed   By: Constance Holster M.D.   On: 11/10/2018 02:22   [place] Art/ven Flow Abd Pelv Doppler  Result Date: 11/10/2018 CLINICAL DATA:  Right lower quadrant pain. Recent abortion. Fever, elevated white count. EXAM: TRANSABDOMINAL AND TRANSVAGINAL ULTRASOUND OF PELVIS DOPPLER ULTRASOUND OF OVARIES TECHNIQUE: Both transabdominal and transvaginal ultrasound examinations of the pelvis were performed. Transabdominal technique was performed for global imaging of the pelvis including uterus, ovaries, adnexal regions, and pelvic cul-de-sac. It was necessary to proceed with endovaginal exam following the transabdominal exam to visualize the ovaries. Color and duplex Doppler ultrasound was utilized to evaluate blood flow to the ovaries. COMPARISON:  11/03/2018, 10/18/2018 FINDINGS: Uterus Measurements: 17.4 x 13.8 x 15.7 cm = volume: 1953 mL. Multiple large fibroids. The largest fibroid is a right posterior fibroid measuring up to 20 cm. Endometrium Thickness: Not visualized due to fibroids. Right ovary Measurements: Not visualized, likely due to large right fibroid. Left ovary Measurements: 5.1 x 3.6 x 4.4 cm = volume: 41 mL. Normal appearance/no adnexal mass. Pulsed Doppler evaluation of the left ovary demonstrates normal low-resistance arterial and venous waveforms. Other findings No abnormal free fluid. IMPRESSION: Enlarged fibroid uterus. Multiple large fibroids including 20 cm right posterior fundal fibroid. Endometrium cannot be visualized due to multiple fibroids. Electronically Signed   By: Rolm Baptise M.D.   On: 11/10/2018 00:33   Dg Chest Portable 1 View  Result Date: 11/10/2018 CLINICAL DATA:  Fever. EXAM: PORTABLE CHEST 1 VIEW COMPARISON:  None. FINDINGS: Heart size is enlarged. The patient has bibasilar airspace opacities are better visualized on  prior CT. There are few scattered airspace opacities throughout the upper lung zones. There is no pneumothorax. No large pleural effusion. The lung volumes are somewhat low. a IMPRESSION: 1. Previously noted bibasilar airspace opacities are better visualized on prior CT. 2. Additional small scattered opacities in the upper lung zones are nonspecific and may be secondary to the somewhat low lung volumes or could represent additional pulmonary opacities. 3. Cardiomegaly Electronically Signed   By: Constance Holster M.D.   On: 11/10/2018 02:54   [place] Mfm Ob Limited  Result Date: 11/05/2018 ----------------------------------------------------------------------  OBSTETRICS REPORT                    (Corrected Final 11/05/2018 12:08 am) ---------------------------------------------------------------------- Patient Info  ID #:       [id number]                          D.O.B.:  12-Feb-1976 (43 yrs)  Name:       OSIRIS CHARLES Lonsway              Visit Date: 11/03/2018 07:21 pm ---------------------------------------------------------------------- Performed By  Performed By:     Dorena Dew     Ref. Address:      [address], [place]  [place], [place]  Attending:        Sander Nephew      Secondary Phy.:    MAU Nursing-                    MD                                                              MAU/Triage  Referred By:      Chancy Milroy        [facility]                                        [place] ---------------------------------------------------------------------- Orders   #  Description                          Code         Ordered By   1  [place] MFM OB LIMITED                    99242.68     Arlina Robes   ----------------------------------------------------------------------   #  Order #                    Accession #                 Episode #   1  [id number]                  [id number]                  [id number]  ---------------------------------------------------------------------- Indications   Abnormal fetal heart rate                      O76   [redacted] weeks gestation of pregnancy                Z3A.17   Determine Fetal presentation by ultrasound     Z36.89   Uterine fibroids affecting pregnancy in        O34.12, D25.9   second trimester, antepartum  ---------------------------------------------------------------------- Fetal Evaluation  Num Of Fetuses:          1  Cardiac Activity:        Absent  Presentation:            Breech  Placenta:                Left lateral  P. Cord Insertion:       Not well visualized  Amniotic Fluid  AFI FV:      Oligohydramnios  due to ROM ---------------------------------------------------------------------- OB History  Gravidity:    4         Term:   1        Prem:   0        SAB:   0  TOP:          2       Ectopic:  0        Living: 1 ---------------------------------------------------------------------- Gestational Age  Best:          17w 5d     Det. By:  Previous Ultrasound      EDD:   04/08/19                                      (10/21/18) ---------------------------------------------------------------------- Cervix Uterus Adnexa  Cervix  Normal appearance by transabdominal scan. ---------------------------------------------------------------------- Myomas   Site                     L(cm)      W(cm)      D(cm)       Location   Fundus                   23.1       21.3       16.8        Pedunculated   Fundus                   10.9       8.2        9.4   Left                     8.1        6.2        5.8  ----------------------------------------------------------------------   Blood Flow                 RI        PI       Comments                                                 In  upper abdomen  ---------------------------------------------------------------------- Impression  IUFD-incomplete abortion.  Oligohydramnios  Fibroid uterus ---------------------------------------------------------------------- Recommendations  Clinical correlation recommended. ----------------------------------------------------------------------                    Sander Nephew, MD Electronically Signed Corrected Final Report  11/05/2018 12:08 am ----------------------------------------------------------------------  US Abdomen Limited Ruq  Result Date: 11/10/2018 CLINICAL DATA:  Right upper quadrant pain EXAM: ULTRASOUND ABDOMEN LIMITED RIGHT UPPER QUADRANT COMPARISON:  None. FINDINGS: Gallbladder: No gallstones or wall thickening visualized. No sonographic Murphy sign noted by sonographer. Common bile duct: Diameter: Normal caliber, 3 mm Liver: No focal lesion identified. Within normal limits in parenchymal echogenicity. Portal vein is patent on color Doppler imaging with normal direction of blood flow towards the liver. Enlarged fibroid uterus extends into the upper abdomen. IMPRESSION: No acute right upper quadrant process. Enlarged fibroid uterus extends into the upper abdomen. Electronically Signed   By: Rolm Baptise M.D.   On: 11/10/2018 00:38      Assessment: 34 week size fibroid uterus, largest is 23 cm Abdominal pain due to degenerating  fibroids    Plan: Abdominal hysterectomy, with pre op type and cross, possible pre op transfusion, she received feraheme and depo provera in the hospital previously, hopefully hemoglobin will be in the 10 range but likely will require a transfusion  Scheduled 11/28/2018  [place] 11/16/2018 10:29 AM

## 2018-11-20 NOTE — Patient Instructions (Signed)
Kimberly Black  11/20/2018     @PREFPERIOPPHARMACY @   Your procedure is scheduled on  11/28/2018   Report to [facility] at  74  A.M.  Call this number if you have problems the morning of surgery:  [phone]   Remember:  Do not eat or drink after midnight.                        Take these medicines the morning of surgery with A SIP OF WATER  hydrocodone    Do not wear jewelry, make-up or nail polish.  Do not wear lotions, powders, or perfumes, or deodorant.  Do not shave 48 hours prior to surgery.  Men may shave face and neck.  Do not bring valuables to the hospital.  [facility] is not responsible for any belongings or valuables.  Contacts, dentures or bridgework may not be worn into surgery.  Leave your suitcase in the car.  After surgery it may be brought to your room.  For patients admitted to the hospital, discharge time will be determined by your treatment team.  Patients discharged the day of surgery will not be allowed to drive home.   Name and phone number of your driver:   family Special instructions:  None  Please read over the following fact sheets that you were given. Pain Booklet, Coughing and Deep Breathing, Blood Transfusion Information, Surgical Site Infection Prevention, Anesthesia Post-op Instructions and Care and Recovery After Surgery       Abdominal Hysterectomy, Care After This sheet gives you information about how to care for yourself after your procedure. Your doctor may also give you more specific instructions. If you have problems or questions, contact your doctor. Follow these instructions at home: Bathing  Do not take baths, swim, or use a hot tub until your doctor says it is okay. Ask your doctor if you can take showers. You may only be allowed to take sponge baths for bathing.  Keep the bandage (dressing) dry until your doctor says it can be taken off. Surgical cut (incision) care      Follow instructions from your  doctor about how to take care of your cut from surgery. Make sure you: ? Wash your hands with soap and water before you change your bandage (dressing). If you cannot use soap and water, use hand sanitizer. ? Change your bandage as told by your doctor. ? Leave stitches (sutures), skin glue, or skin tape (adhesive) strips in place. They may need to stay in place for 2 weeks or longer. If tape strips get loose and curl up, you may trim the loose edges. Do not remove tape strips completely unless your doctor says it is okay.  Check your surgical cut area every day for signs of infection. Check for: ? Redness, swelling, or pain. ? Fluid or blood. ? Warmth. ? Pus or a bad smell. Activity  Do gentle, daily exercise as told by your doctor. You may be told to take short walks every day and go farther each time.  Do not lift anything that is heavier than 10 lb (4.5 kg), or the limit that your doctor tells you, until he or she says that it is safe.  Do not drive or use heavy machinery while taking prescription pain medicine.  Do not drive for 24 hours if you were given a medicine to help you relax (sedative).  Follow your doctor's advice  about exercise, driving, and general activities. Ask your doctor what activities are safe for you. Lifestyle   Do not douche, use tampons, or have sex for at least 6 weeks or as told by your doctor.  Do not drink alcohol until your doctor says it is okay.  Drink enough fluid to keep your pee (urine) clear or pale yellow.  Try to have someone at home with you for the first 1-2 weeks to help.  Do not use any products that contain nicotine or tobacco, such as cigarettes and e-cigarettes. These can slow down healing. If you need help quitting, ask your doctor. General instructions  Take over-the-counter and prescription medicines only as told by your doctor.  Do not take aspirin or ibuprofen. These medicines can cause bleeding.  To prevent or treat  constipation while you are taking prescription pain medicine, your doctor may suggest that you: ? Drink enough fluid to keep your urine clear or pale yellow. ? Take over-the-counter or prescription medicines. ? Eat foods that are high in fiber, such as:  Fresh fruits and vegetables.  Whole grains.  Beans. ? Limit foods that are high in fat and processed sugars, such as fried and sweet foods.  Keep all follow-up visits as told by your doctor. This is important. Contact a doctor if:  You have chills or fever.  You have redness, swelling, or pain around your cut.  You have fluid or blood coming from your cut.  Your cut feels warm to the touch.  You have pus or a bad smell coming from your cut.  Your cut breaks open.  You feel dizzy or light-headed.  You have pain or bleeding when you pee.  You keep having watery poop (diarrhea).  You keep feeling sick to your stomach (nauseous) or keep throwing up (vomiting).  You have unusual fluid (discharge) coming from your vagina.  You have a rash.  You have a reaction to your medicine.  Your pain medicine does not help. Get help right away if:  You have a fever and your symptoms get worse all of a sudden.  You have very bad belly (abdominal) pain.  You are short of breath.  You pass out (faint).  You have pain, swelling, or redness of your leg.  You bleed a lot from your vagina and notice clumps of blood (clots). Summary  Do not take baths, swim, or use a hot tub until your doctor says it is okay. Ask your doctor if you can take showers. You may only be allowed to take sponge baths for bathing.  Follow your doctor's advice about exercise, driving, and general activities. Ask your doctor what activities are safe for you.  Do not lift anything that is heavier than 10 lb (4.5 kg), or the limit that your doctor tells you, until he or she says that it is safe.  Try to have someone at home with you for the first 1-2 weeks  to help. This information is not intended to replace advice given to you by your health care provider. Make sure you discuss any questions you have with your health care provider. Document Released: 02/23/2008 Document Revised: 05/04/2016 Document Reviewed: 05/04/2016 Elsevier Interactive Patient Education  2019 [employer] Anesthesia, Adult, Care After This sheet gives you information about how to care for yourself after your procedure. Your health care provider may also give you more specific instructions. If you have problems or questions, contact your health care provider. What can I  expect after the procedure? After the procedure, the following side effects are common:  Pain or discomfort at the IV site.  Nausea.  Vomiting.  Sore throat.  Trouble concentrating.  Feeling cold or chills.  Weak or tired.  Sleepiness and fatigue.  Soreness and body aches. These side effects can affect parts of the body that were not involved in surgery. Follow these instructions at home:  For at least 24 hours after the procedure:  Have a responsible adult stay with you. It is important to have someone help care for you until you are awake and alert.  Rest as needed.  Do not: ? Participate in activities in which you could fall or become injured. ? Drive. ? Use heavy machinery. ? Drink alcohol. ? Take sleeping pills or medicines that cause drowsiness. ? Make important decisions or sign legal documents. ? Take care of children on your own. Eating and drinking  Follow any instructions from your health care provider about eating or drinking restrictions.  When you feel hungry, start by eating small amounts of foods that are soft and easy to digest (bland), such as toast. Gradually return to your regular diet.  Drink enough fluid to keep your urine pale yellow.  If you vomit, rehydrate by drinking water, juice, or clear broth. General instructions  If you have sleep  apnea, surgery and certain medicines can increase your risk for breathing problems. Follow instructions from your health care provider about wearing your sleep device: ? Anytime you are sleeping, including during daytime naps. ? While taking prescription pain medicines, sleeping medicines, or medicines that make you drowsy.  Return to your normal activities as told by your health care provider. Ask your health care provider what activities are safe for you.  Take over-the-counter and prescription medicines only as told by your health care provider.  If you smoke, do not smoke without supervision.  Keep all follow-up visits as told by your health care provider. This is important. Contact a health care provider if:  You have nausea or vomiting that does not get better with medicine.  You cannot eat or drink without vomiting.  You have pain that does not get better with medicine.  You are unable to pass urine.  You develop a skin rash.  You have a fever.  You have redness around your IV site that gets worse. Get help right away if:  You have difficulty breathing.  You have chest pain.  You have blood in your urine or stool, or you vomit blood. Summary  After the procedure, it is common to have a sore throat or nausea. It is also common to feel tired.  Have a responsible adult stay with you for the first 24 hours after general anesthesia. It is important to have someone help care for you until you are awake and alert.  When you feel hungry, start by eating small amounts of foods that are soft and easy to digest (bland), such as toast. Gradually return to your regular diet.  Drink enough fluid to keep your urine pale yellow.  Return to your normal activities as told by your health care provider. Ask your health care provider what activities are safe for you. This information is not intended to replace advice given to you by your health care provider. Make sure you discuss any  questions you have with your health care provider. Document Released: 08/22/2000 Document Revised: 12/30/2016 Document Reviewed: 12/30/2016 Elsevier Interactive Patient Education  2019 [employer]. How  to Use Chlorhexidine Before Surgery Chlorhexidine gluconate (CHG) is a germ-killing (antiseptic) solution that is used to clean the skin. It gets rid of the bacteria that normally live on the skin. Cleaning your skin with CHG before surgery helps lower the risk for infection after surgery. To clean your skin before surgery, you may be given:  A CHG solution to use in the shower.  A prepackaged cloth that contains CHG. What are the risks? Risks of using CHG include:  A skin reaction.  Hearing loss, if CHG gets in your ears.  Eye injury, if CHG gets in your eyes and is not rinsed out.  The CHG product catching fire. Make sure that you avoid smoking and flames after applying CHG to your skin. Do not use CHG:  If you have a chlorhexidine allergy or have previously reacted to chlorhexidine.  On babies younger than 38 months of age. How to use CHG solution   Use CHG only as told by your health care provider, and follow the instructions on the label.  Use CHG solution while taking a shower. Follow these steps when using CHG solution (unless your health care provider gives you different instructions): 1. Start the shower. 2. Use your normal soap and shampoo to wash your face and hair. 3. Turn off the shower or move out of the shower stream. 4. Pour the CHG onto a clean washcloth. Do not use any type of brush or rough-edged sponge. 5. Starting at your neck, lather your body down to your toes. Make sure you:  Pay special attention to the part of your body where you will be having surgery. Scrub this area for at least 1 minute.  Use the full amount of CHG as directed. Usually, this is one bottle.  Do not use CHG on your head or face. If the solution gets into your ears or eyes, rinse  them well with water.  Avoid your genital area.  Avoid any areas of skin that have broken skin, cuts, or scrapes.  Scrub your back and under your arms. Make sure to wash skin folds. 6. Let the lather sit on your skin for 1-2 minutes or as long as told by your health care provider. 7. Thoroughly rinse your entire body in the shower. Make sure that all body creases and crevices are rinsed well. 8. Dry off with a clean towel. Do not put any substances on your body afterward, such as powder, lotion, or perfume. 9. Put on clean clothes or pajamas. 10. If it is the night before your surgery, sleep in clean sheets. How to use CHG prepackaged cloths   Only use CHG cloths as told by your health care provider, and follow the instructions on the label.  Use the CHG cloth on clean, dry skin. Follow these steps when using a CHG cloth (unless your health care provider gives you different instructions): 1. Using the CHG cloth, vigorously scrub the part of your body where you will be having surgery. Scrub using a back-and-forth motion for 3 minutes. The area on your body should be completely wet with CHG when you are done scrubbing. 2. Do not rinse. Discard the cloth and let the area air-dry for 1 minute. Do not put any substances on your body afterward, such as powder, lotion, or perfume. 3. Put on clean clothes or pajamas. 4. If it is the night before your surgery, sleep in clean sheets. Contact a health care provider if:  Your skin gets irritated after scrubbing.  You have questions about using your solution or cloth. Get help right away if:  Your eyes become very red or swollen.  Your eyes itch badly.  Your skin itches badly and is red or swollen.  Your hearing changes.  You have trouble seeing.  You have swelling or tingling in your mouth or throat.  You have trouble breathing.  You swallow any chlorhexidine. Summary  Chlorhexidine gluconate (CHG) is a germ-killing (antiseptic)  solution that is used to clean the skin. Cleaning your skin with CHG before surgery helps lower the risk for infection after surgery.  You may be given CHG to use at home. It may be in a bottle or in a prepackaged cloth to use on your skin. Carefully follow your health care provider's instructions and the instructions on the product label.  Do not use CHG if you have a chlorhexidine allergy.  Contact your health care provider if your skin gets irritated after scrubbing. This information is not intended to replace advice given to you by your health care provider. Make sure you discuss any questions you have with your health care provider. Document Released: 02/08/2012 Document Revised: 04/13/2017 Document Reviewed: 04/13/2017 Elsevier Interactive Patient Education  2019 [employer].

## 2018-11-23 ENCOUNTER — Other Ambulatory Visit (HOSPITAL_COMMUNITY)
Admission: RE | Admit: 2018-11-23 | Discharge: 2018-11-23 | Disposition: A | Discharge status: Home / Self Care | Payer: Medicare Other | Source: Ambulatory Visit | Attending: Obstetrics & Gynecology | Admitting: Obstetrics & Gynecology

## 2018-11-23 ENCOUNTER — Encounter (HOSPITAL_COMMUNITY)
Admission: RE | Admit: 2018-11-23 | Discharge: 2018-11-23 | Disposition: A | Discharge status: Home / Self Care | Payer: Medicare Other | Source: Ambulatory Visit | Attending: Obstetrics & Gynecology | Admitting: Obstetrics & Gynecology

## 2018-11-23 ENCOUNTER — Encounter (HOSPITAL_COMMUNITY): Payer: Self-pay

## 2018-11-23 ENCOUNTER — Other Ambulatory Visit: Payer: Self-pay

## 2018-11-23 DIAGNOSIS — Z1159 Encounter for screening for other viral diseases: Secondary | ICD-10-CM | POA: Insufficient documentation

## 2018-11-23 DIAGNOSIS — Z79899 Other long term (current) drug therapy: Secondary | ICD-10-CM | POA: Insufficient documentation

## 2018-11-23 DIAGNOSIS — Z8759 Personal history of other complications of pregnancy, childbirth and the puerperium: Secondary | ICD-10-CM | POA: Insufficient documentation

## 2018-11-23 DIAGNOSIS — D259 Leiomyoma of uterus, unspecified: Secondary | ICD-10-CM | POA: Diagnosis not present

## 2018-11-23 DIAGNOSIS — Z01812 Encounter for preprocedural laboratory examination: Secondary | ICD-10-CM | POA: Insufficient documentation

## 2018-11-23 LAB — URINALYSIS, ROUTINE W REFLEX MICROSCOPIC
Bilirubin Urine: NEGATIVE
Glucose, UA: NEGATIVE mg/dL
Ketones, ur: NEGATIVE mg/dL
Nitrite: NEGATIVE
Protein, ur: NEGATIVE mg/dL
Specific Gravity, Urine: 1.019 (ref 1.005–1.030)
pH: 5 (ref 5.0–8.0)

## 2018-11-23 LAB — COMPREHENSIVE METABOLIC PANEL
ALT: 18 U/L (ref 0–44)
AST: 11 U/L — ABNORMAL LOW (ref 15–41)
Albumin: 3.4 g/dL — ABNORMAL LOW (ref 3.5–5.0)
Alkaline Phosphatase: 95 U/L (ref 38–126)
Anion gap: 12 (ref 5–15)
BUN: 11 mg/dL (ref 6–20)
CO2: 26 mmol/L (ref 22–32)
Calcium: 9.2 mg/dL (ref 8.9–10.3)
Chloride: 101 mmol/L (ref 98–111)
Creatinine, Ser: 0.48 mg/dL (ref 0.44–1.00)
GFR calc Af Amer: >60 mL/min (ref >60)
GFR calc non Af Amer: >60 mL/min (ref >60)
Glucose, Bld: 104 mg/dL — ABNORMAL HIGH (ref 70–99)
Potassium: 3.8 mmol/L (ref 3.5–5.1)
Sodium: 139 mmol/L (ref 135–145)
Total Bilirubin: 0.4 mg/dL (ref 0.3–1.2)
Total Protein: 7.6 g/dL (ref 6.5–8.1)

## 2018-11-23 LAB — CBC
HCT: 27 % — ABNORMAL LOW (ref 36.0–46.0)
Hemoglobin: 7.8 g/dL — ABNORMAL LOW (ref 12.0–15.0)
MCH: 21.2 pg — ABNORMAL LOW (ref 26.0–34.0)
MCHC: 28.9 g/dL — ABNORMAL LOW (ref 30.0–36.0)
MCV: 73.4 fL — ABNORMAL LOW (ref 80.0–100.0)
Platelets: 777 10*3/uL — ABNORMAL HIGH (ref 150–400)
RBC: 3.68 MIL/uL — ABNORMAL LOW (ref 3.87–5.11)
RDW: 18.7 % — ABNORMAL HIGH (ref 11.5–15.5)
WBC: 14.7 10*3/uL — ABNORMAL HIGH (ref 4.0–10.5)
nRBC: 0.3 % — ABNORMAL HIGH (ref 0.0–0.2)

## 2018-11-23 LAB — RAPID HIV SCREEN (HIV 1/2 AB+AG)
HIV 1/2 Antibodies: NONREACTIVE
HIV-1 P24 Antigen - HIV24: NONREACTIVE

## 2018-11-23 LAB — HCG, QUANTITATIVE, PREGNANCY: hCG, Beta Chain, Quant, S: 4 m[IU]/mL (ref <5)

## 2018-11-23 MED ORDER — OXYCODONE HCL 5 MG/5ML PO SOLN: 5.0000 mg | Freq: Once | ORAL | Status: DC | PRN

## 2018-11-23 MED ORDER — MEPERIDINE HCL 50 MG/ML IJ SOLN: 6.2500 mg | INTRAMUSCULAR | Status: DC | PRN

## 2018-11-23 MED ORDER — OXYCODONE HCL 5 MG PO TABS: 5.0000 mg | ORAL_TABLET | Freq: Once | ORAL | Status: DC | PRN

## 2018-11-23 MED ORDER — METOCLOPRAMIDE HCL 5 MG/ML IJ SOLN: 10.0000 mg | Freq: Once | INTRAMUSCULAR | Status: DC | PRN

## 2018-11-24 LAB — NOVEL CORONAVIRUS, NAA (HOSP ORDER, SEND-OUT TO REF LAB; TAT 18-24 HRS): SARS-CoV-2, NAA: NOT DETECTED

## 2018-11-26 NOTE — Pre-Procedure Instructions (Signed)
H&H routed to Dr Eure. 

## 2018-11-28 ENCOUNTER — Encounter (HOSPITAL_COMMUNITY)
Admission: RE | Disposition: A | Discharge status: Home / Self Care | Payer: Self-pay | Source: Home / Self Care | Attending: Obstetrics & Gynecology

## 2018-11-28 ENCOUNTER — Inpatient Hospital Stay (HOSPITAL_COMMUNITY): Payer: Medicare Other | Admitting: Anesthesiology

## 2018-11-28 ENCOUNTER — Other Ambulatory Visit: Payer: Self-pay

## 2018-11-28 ENCOUNTER — Encounter (HOSPITAL_COMMUNITY): Payer: Self-pay | Admitting: *Deleted

## 2018-11-28 ENCOUNTER — Inpatient Hospital Stay (HOSPITAL_COMMUNITY)
Admission: RE | Admit: 2018-11-28 | Discharge: 2018-11-29 | DRG: 743 | Disposition: A | Discharge status: Home / Self Care | Payer: Medicare Other | Attending: Obstetrics & Gynecology | Admitting: Obstetrics & Gynecology

## 2018-11-28 DIAGNOSIS — Z825 Family history of asthma and other chronic lower respiratory diseases: Secondary | ICD-10-CM | POA: Diagnosis not present

## 2018-11-28 DIAGNOSIS — Z8249 Family history of ischemic heart disease and other diseases of the circulatory system: Secondary | ICD-10-CM

## 2018-11-28 DIAGNOSIS — Z90710 Acquired absence of both cervix and uterus: Secondary | ICD-10-CM | POA: Diagnosis present

## 2018-11-28 DIAGNOSIS — Z833 Family history of diabetes mellitus: Secondary | ICD-10-CM

## 2018-11-28 DIAGNOSIS — Z809 Family history of malignant neoplasm, unspecified: Secondary | ICD-10-CM

## 2018-11-28 DIAGNOSIS — D259 Leiomyoma of uterus, unspecified: Secondary | ICD-10-CM | POA: Diagnosis present

## 2018-11-28 DIAGNOSIS — D500 Iron deficiency anemia secondary to blood loss (chronic): Secondary | ICD-10-CM | POA: Diagnosis present

## 2018-11-28 HISTORY — PX: ABDOMINAL HYSTERECTOMY: SHX81

## 2018-11-28 HISTORY — PX: SALPINGOOPHORECTOMY: SHX82

## 2018-11-28 LAB — PREPARE RBC (CROSSMATCH)

## 2018-11-28 LAB — ABO/RH: ABO/RH(D): O POS

## 2018-11-28 LAB — POCT HEMOGLOBIN-HEMACUE: Hemoglobin: 9.7 g/dL — ABNORMAL LOW (ref 12.0–15.0)

## 2018-11-28 SURGERY — HYSTERECTOMY, ABDOMINAL
Anesthesia: General | Laterality: Right

## 2018-11-28 MED ORDER — KCL IN DEXTROSE-NACL 40-5-0.45 MEQ/L-%-% IV SOLN
INTRAVENOUS | Status: DC
  Administered 2018-11-28: 15:00:00 via INTRAVENOUS

## 2018-11-28 MED ORDER — FENTANYL CITRATE (PF) 250 MCG/5ML IJ SOLN
INTRAMUSCULAR | Status: AC
  Filled 2018-11-28: qty 5

## 2018-11-28 MED ORDER — ENOXAPARIN SODIUM 40 MG/0.4ML ~~LOC~~ SOLN
40.0000 mg | SUBCUTANEOUS | Status: DC
  Administered 2018-11-29: 40 mg via SUBCUTANEOUS
  Filled 2018-11-28: qty 0.4

## 2018-11-28 MED ORDER — KETOROLAC TROMETHAMINE 30 MG/ML IJ SOLN
30.0000 mg | Freq: Once | INTRAMUSCULAR | Status: AC
  Administered 2018-11-28: 30 mg via INTRAVENOUS
  Filled 2018-11-28: qty 1

## 2018-11-28 MED ORDER — HEMOSTATIC AGENTS (NO CHARGE) OPTIME
TOPICAL | Status: DC | PRN
  Administered 2018-11-28 (×2): 1 via TOPICAL

## 2018-11-28 MED ORDER — SODIUM CHLORIDE (PF) 0.9 % IJ SOLN
INTRAMUSCULAR | Status: AC
  Filled 2018-11-28: qty 10

## 2018-11-28 MED ORDER — HYDROMORPHONE HCL 1 MG/ML IJ SOLN
0.2500 mg | INTRAMUSCULAR | Status: DC | PRN
  Administered 2018-11-28 (×6): 0.5 mg via INTRAVENOUS
  Filled 2018-11-28 (×2): qty 0.5

## 2018-11-28 MED ORDER — SODIUM CHLORIDE 0.9 % IV SOLN
INTRAVENOUS | Status: DC | PRN
  Administered 2018-11-28: 40 mL

## 2018-11-28 MED ORDER — DEXAMETHASONE SODIUM PHOSPHATE 10 MG/ML IJ SOLN
INTRAMUSCULAR | Status: AC
  Filled 2018-11-28: qty 1

## 2018-11-28 MED ORDER — BUPIVACAINE LIPOSOME 1.3 % IJ SUSP
INTRAMUSCULAR | Status: AC
  Filled 2018-11-28: qty 20

## 2018-11-28 MED ORDER — LEVOFLOXACIN IN D5W 750 MG/150ML IV SOLN
750.0000 mg | Freq: Once | INTRAVENOUS | Status: AC
  Administered 2018-11-28: 20:00:00 750 mg via INTRAVENOUS
  Filled 2018-11-28: qty 150

## 2018-11-28 MED ORDER — PROPOFOL 10 MG/ML IV BOLUS
INTRAVENOUS | Status: AC
  Filled 2018-11-28: qty 20

## 2018-11-28 MED ORDER — FENTANYL CITRATE (PF) 100 MCG/2ML IJ SOLN
50.0000 ug | INTRAMUSCULAR | Status: DC | PRN
  Administered 2018-11-28: 100 ug via INTRAVENOUS
  Filled 2018-11-28: qty 2

## 2018-11-28 MED ORDER — EPHEDRINE SULFATE 50 MG/ML IJ SOLN
INTRAMUSCULAR | Status: DC | PRN
  Administered 2018-11-28 (×2): 10 mg via INTRAVENOUS

## 2018-11-28 MED ORDER — FENTANYL CITRATE (PF) 100 MCG/2ML IJ SOLN
INTRAMUSCULAR | Status: DC | PRN
  Administered 2018-11-28 (×5): 50 ug via INTRAVENOUS
  Administered 2018-11-28: 100 ug via INTRAVENOUS
  Administered 2018-11-28: 50 ug via INTRAVENOUS
  Administered 2018-11-28: 100 ug via INTRAVENOUS

## 2018-11-28 MED ORDER — CEFAZOLIN SODIUM-DEXTROSE 2-4 GM/100ML-% IV SOLN
2.0000 g | INTRAVENOUS | Status: AC
  Administered 2018-11-28: 2 g via INTRAVENOUS

## 2018-11-28 MED ORDER — NALOXONE HCL 0.4 MG/ML IJ SOLN
INTRAMUSCULAR | Status: AC
  Filled 2018-11-28: qty 1

## 2018-11-28 MED ORDER — KETOROLAC TROMETHAMINE 30 MG/ML IJ SOLN
30.0000 mg | Freq: Four times a day (QID) | INTRAMUSCULAR | Status: AC
  Administered 2018-11-28 – 2018-11-29 (×4): 30 mg via INTRAVENOUS
  Filled 2018-11-28 (×5): qty 1

## 2018-11-28 MED ORDER — MIDAZOLAM HCL 2 MG/2ML IJ SOLN: 0.5000 mg | Freq: Once | INTRAMUSCULAR | Status: DC | PRN

## 2018-11-28 MED ORDER — PROMETHAZINE HCL 25 MG/ML IJ SOLN
25.0000 mg | Freq: Four times a day (QID) | INTRAMUSCULAR | Status: DC | PRN
  Administered 2018-11-28: 25 mg via INTRAVENOUS
  Filled 2018-11-28: qty 1

## 2018-11-28 MED ORDER — PHENYLEPHRINE HCL (PRESSORS) 10 MG/ML IV SOLN
INTRAVENOUS | Status: DC | PRN
  Administered 2018-11-28 (×2): 80 ug via INTRAVENOUS

## 2018-11-28 MED ORDER — SUGAMMADEX SODIUM 200 MG/2ML IV SOLN
INTRAVENOUS | Status: DC | PRN
  Administered 2018-11-28: 206 mg via INTRAVENOUS

## 2018-11-28 MED ORDER — SODIUM CHLORIDE 0.9 % IR SOLN
Status: DC | PRN
  Administered 2018-11-28 (×3): 1000 mL

## 2018-11-28 MED ORDER — ROCURONIUM BROMIDE 50 MG/5ML IV SOSY
PREFILLED_SYRINGE | INTRAVENOUS | Status: DC | PRN
  Administered 2018-11-28: 10 mg via INTRAVENOUS
  Administered 2018-11-28: 40 mg via INTRAVENOUS
  Administered 2018-11-28: 10 mg via INTRAVENOUS

## 2018-11-28 MED ORDER — LACTATED RINGERS IV SOLN
INTRAVENOUS | Status: DC
  Administered 2018-11-28: 11:00:00 via INTRAVENOUS

## 2018-11-28 MED ORDER — ONDANSETRON HCL 4 MG PO TABS: 8.0000 mg | ORAL_TABLET | Freq: Four times a day (QID) | ORAL | Status: DC | PRN

## 2018-11-28 MED ORDER — SODIUM CHLORIDE (PF) 0.9 % IJ SOLN
INTRAMUSCULAR | Status: AC
  Filled 2018-11-28: qty 20

## 2018-11-28 MED ORDER — ROCURONIUM BROMIDE 10 MG/ML (PF) SYRINGE
PREFILLED_SYRINGE | INTRAVENOUS | Status: AC
  Filled 2018-11-28: qty 10

## 2018-11-28 MED ORDER — BUPIVACAINE LIPOSOME 1.3 % IJ SUSP: 20.0000 mL | Freq: Once | INTRAMUSCULAR | Status: DC

## 2018-11-28 MED ORDER — CEFAZOLIN SODIUM-DEXTROSE 2-4 GM/100ML-% IV SOLN
INTRAVENOUS | Status: AC
  Filled 2018-11-28: qty 100

## 2018-11-28 MED ORDER — DIPHENHYDRAMINE HCL 50 MG/ML IJ SOLN: 25.0000 mg | Freq: Four times a day (QID) | INTRAMUSCULAR | Status: DC | PRN

## 2018-11-28 MED ORDER — BISACODYL 10 MG RE SUPP: 10.0000 mg | Freq: Every day | RECTAL | Status: DC | PRN

## 2018-11-28 MED ORDER — GABAPENTIN 100 MG PO CAPS
100.0000 mg | ORAL_CAPSULE | Freq: Three times a day (TID) | ORAL | Status: DC
  Administered 2018-11-28 – 2018-11-29 (×3): 100 mg via ORAL
  Filled 2018-11-28 (×3): qty 1

## 2018-11-28 MED ORDER — HYDROCODONE-ACETAMINOPHEN 7.5-325 MG PO TABS: 1.0000 | ORAL_TABLET | Freq: Once | ORAL | Status: DC | PRN

## 2018-11-28 MED ORDER — SUCCINYLCHOLINE CHLORIDE 20 MG/ML IJ SOLN
INTRAMUSCULAR | Status: DC | PRN
  Administered 2018-11-28: 180 mg via INTRAVENOUS

## 2018-11-28 MED ORDER — DOCUSATE SODIUM 100 MG PO CAPS
100.0000 mg | ORAL_CAPSULE | Freq: Two times a day (BID) | ORAL | Status: DC
  Administered 2018-11-28 – 2018-11-29 (×3): 100 mg via ORAL
  Filled 2018-11-28 (×3): qty 1

## 2018-11-28 MED ORDER — MIDAZOLAM HCL 5 MG/5ML IJ SOLN
INTRAMUSCULAR | Status: DC | PRN
  Administered 2018-11-28: 2 mg via INTRAVENOUS

## 2018-11-28 MED ORDER — IBUPROFEN 800 MG PO TABS
800.0000 mg | ORAL_TABLET | Freq: Four times a day (QID) | ORAL | Status: DC
  Administered 2018-11-29: 800 mg via ORAL
  Filled 2018-11-28 (×2): qty 1

## 2018-11-28 MED ORDER — PROMETHAZINE HCL 25 MG/ML IJ SOLN: 6.2500 mg | INTRAMUSCULAR | Status: DC | PRN

## 2018-11-28 MED ORDER — LIDOCAINE 2% (20 MG/ML) 5 ML SYRINGE
INTRAMUSCULAR | Status: AC
  Filled 2018-11-28: qty 5

## 2018-11-28 MED ORDER — SODIUM CHLORIDE 0.9 % IV SOLN
8.0000 mg | Freq: Four times a day (QID) | INTRAVENOUS | Status: DC | PRN
  Filled 2018-11-28: qty 4

## 2018-11-28 MED ORDER — ONDANSETRON HCL 4 MG/2ML IJ SOLN
INTRAMUSCULAR | Status: AC
  Filled 2018-11-28: qty 2

## 2018-11-28 MED ORDER — ZOLPIDEM TARTRATE 5 MG PO TABS: 5.0000 mg | ORAL_TABLET | Freq: Every evening | ORAL | Status: DC | PRN

## 2018-11-28 MED ORDER — SODIUM CHLORIDE 0.9 % IV SOLN
INTRAVENOUS | Status: DC | PRN
  Administered 2018-11-28: 10:00:00 via INTRAVENOUS

## 2018-11-28 MED ORDER — ALUM & MAG HYDROXIDE-SIMETH 200-200-20 MG/5ML PO SUSP: 30.0000 mL | ORAL | Status: DC | PRN

## 2018-11-28 MED ORDER — DEXAMETHASONE SODIUM PHOSPHATE 4 MG/ML IJ SOLN
INTRAMUSCULAR | Status: DC | PRN
  Administered 2018-11-28: 8 mg via INTRAVENOUS

## 2018-11-28 MED ORDER — HYDROMORPHONE HCL 1 MG/ML IJ SOLN
INTRAMUSCULAR | Status: AC
  Filled 2018-11-28: qty 1

## 2018-11-28 MED ORDER — ONDANSETRON HCL 4 MG/2ML IJ SOLN
INTRAMUSCULAR | Status: DC | PRN
  Administered 2018-11-28: 4 mg via INTRAVENOUS

## 2018-11-28 MED ORDER — OXYCODONE HCL 5 MG PO TABS
5.0000 mg | ORAL_TABLET | ORAL | Status: DC | PRN
  Administered 2018-11-28 – 2018-11-29 (×5): 10 mg via ORAL
  Filled 2018-11-28 (×5): qty 2

## 2018-11-28 MED ORDER — PROPOFOL 10 MG/ML IV BOLUS
INTRAVENOUS | Status: DC | PRN
  Administered 2018-11-28: 180 mg via INTRAVENOUS

## 2018-11-28 MED ORDER — PHENYLEPHRINE 40 MCG/ML (10ML) SYRINGE FOR IV PUSH (FOR BLOOD PRESSURE SUPPORT)
PREFILLED_SYRINGE | INTRAVENOUS | Status: AC
  Filled 2018-11-28: qty 10

## 2018-11-28 MED ORDER — SENNOSIDES-DOCUSATE SODIUM 8.6-50 MG PO TABS: 1.0000 | ORAL_TABLET | Freq: Every evening | ORAL | Status: DC | PRN

## 2018-11-28 MED ORDER — MIDAZOLAM HCL 2 MG/2ML IJ SOLN
INTRAMUSCULAR | Status: AC
  Filled 2018-11-28: qty 2

## 2018-11-28 MED ORDER — LIDOCAINE 2% (20 MG/ML) 5 ML SYRINGE
INTRAMUSCULAR | Status: DC | PRN
  Administered 2018-11-28: 40 mg via INTRAVENOUS

## 2018-11-28 SURGICAL SUPPLY — 51 items
APL SWBSTK 6 STRL LF DISP (MISCELLANEOUS) ×1
APPLICATOR COTTON TIP 6 STRL (MISCELLANEOUS) ×2 IMPLANT
APPLICATOR COTTON TIP 6IN STRL (MISCELLANEOUS) ×4
BRUSH SCRUB SURG 4.25 DISP (MISCELLANEOUS) ×4 IMPLANT
CLOTH BEACON ORANGE TIMEOUT ST (SAFETY) ×4 IMPLANT
COVER LIGHT HANDLE STERIS (MISCELLANEOUS) ×8 IMPLANT
COVER WAND RF STERILE (DRAPES) ×4 IMPLANT
DERMABOND ADVANCED (GAUZE/BANDAGES/DRESSINGS) ×2
DERMABOND ADVANCED .7 DNX12 (GAUZE/BANDAGES/DRESSINGS) ×2 IMPLANT
DRAPE WARM FLUID 44X44 (DRAPES) ×4 IMPLANT
DRSG OPSITE POSTOP 4X10 (GAUZE/BANDAGES/DRESSINGS) ×4 IMPLANT
DRSG OPSITE POSTOP 4X8 (GAUZE/BANDAGES/DRESSINGS) ×4 IMPLANT
ELECT REM PT RETURN 9FT ADLT (ELECTROSURGICAL) ×4
ELECTRODE REM PT RTRN 9FT ADLT (ELECTROSURGICAL) ×2 IMPLANT
GAUZE 4X4 16PLY RFD (DISPOSABLE) ×8 IMPLANT
GLOVE BIOGEL PI IND STRL 7.0 (GLOVE) ×10 IMPLANT
GLOVE BIOGEL PI IND STRL 8 (GLOVE) ×2 IMPLANT
GLOVE BIOGEL PI INDICATOR 7.0 (GLOVE) ×10
GLOVE BIOGEL PI INDICATOR 8 (GLOVE) ×2
GLOVE ECLIPSE 6.5 STRL STRAW (GLOVE) ×12 IMPLANT
GLOVE ECLIPSE 8.0 STRL XLNG CF (GLOVE) ×4 IMPLANT
GOWN STRL REUS W/TWL LRG LVL3 (GOWN DISPOSABLE) ×8 IMPLANT
GOWN STRL REUS W/TWL XL LVL3 (GOWN DISPOSABLE) ×4 IMPLANT
HEMOSTAT ARISTA ABSORB 3G PWDR (HEMOSTASIS) ×8 IMPLANT
INST SET MAJOR GENERAL (KITS) ×4 IMPLANT
KIT BLADEGUARD II DBL (SET/KITS/TRAYS/PACK) ×4 IMPLANT
KIT TURNOVER KIT A (KITS) ×4 IMPLANT
MANIFOLD NEPTUNE II (INSTRUMENTS) ×4 IMPLANT
NEEDLE HYPO 18GX1.5 BLUNT FILL (NEEDLE) ×12 IMPLANT
NEEDLE HYPO 21X1.5 SAFETY (NEEDLE) ×4 IMPLANT
NS IRRIG 1000ML POUR BTL (IV SOLUTION) ×12 IMPLANT
PACK ABDOMINAL MAJOR (CUSTOM PROCEDURE TRAY) ×4 IMPLANT
PAD ARMBOARD 7.5X6 YLW CONV (MISCELLANEOUS) ×4 IMPLANT
SET BASIN LINEN APH (SET/KITS/TRAYS/PACK) ×4 IMPLANT
SPONGE LAP 18X18 RF (DISPOSABLE) ×16 IMPLANT
STAPLER VISISTAT 35W (STAPLE) ×4 IMPLANT
SUT CHROMIC 0 CT 1 (SUTURE) ×16 IMPLANT
SUT MNCRL 0 VIOLET CTX 36 (SUTURE) ×20 IMPLANT
SUT MON AB 3-0 SH 27 (SUTURE) ×4 IMPLANT
SUT MONOCRYL 0 CTX 36 (SUTURE) ×20
SUT PLAIN 2 0 XLH (SUTURE) ×4 IMPLANT
SUT VIC AB 0 CT1 27 (SUTURE) ×9
SUT VIC AB 0 CT1 27XCR 8 STRN (SUTURE) ×6 IMPLANT
SUT VIC AB 0 CTX 36 (SUTURE) ×8
SUT VIC AB 0 CTX36XBRD ANTBCTR (SUTURE) ×4 IMPLANT
SUT VICRYL 3 0 (SUTURE) ×4 IMPLANT
SYR 10ML LL (SYRINGE) ×8 IMPLANT
SYR 20CC LL (SYRINGE) ×4 IMPLANT
SYR 30ML LL (SYRINGE) ×4 IMPLANT
TOWEL SURG RFD BLUE STRL DISP (DISPOSABLE) ×4 IMPLANT
TRAY FOLEY MTR SLVR 16FR STAT (SET/KITS/TRAYS/PACK) ×4 IMPLANT

## 2018-11-28 NOTE — Anesthesia Preprocedure Evaluation (Addendum)
Anesthesia Evaluation  Patient identified by MRN, date of birth, ID band Patient awake    Reviewed: Allergy & Precautions, NPO status , Patient's Chart, lab work & pertinent test results  Airway Mallampati: II  TM Distance: >3 FB Neck ROM: Full    Dental no notable dental hx. (+) Teeth Intact   Pulmonary neg pulmonary ROS,    Pulmonary exam normal breath sounds clear to auscultation       Cardiovascular Exercise Tolerance: Good negative cardio ROS Normal cardiovascular examI Rhythm:Regular Rate:Normal     Neuro/Psych negative neurological ROS  negative psych ROS   GI/Hepatic negative GI ROS, Neg liver ROS,   Endo/Other  Morbid obesityBMI>40  Renal/GU negative Renal ROS  negative genitourinary   Musculoskeletal negative musculoskeletal ROS (+)   Abdominal   Peds negative pediatric ROS (+)  Hematology negative hematology ROS (+) anemia , H/H ~ 7.8/27  Not on Fe T&C sent plan on giving a unit to start, Blood bank to stay 2 units ahead   Anesthesia Other Findings Pt reports D&E in June, then f/u with D&C at [facility]   Reproductive/Obstetrics negative OB ROS                            Anesthesia Physical Anesthesia Plan  ASA: III  Anesthesia Plan: General   Post-op Pain Management:    Induction: Intravenous  PONV Risk Score and Plan: 3 and Dexamethasone, Ondansetron, Midazolam and Treatment may vary due to age or medical condition  Airway Management Planned: Oral ETT  Additional Equipment:   Intra-op Plan:   Post-operative Plan: Extubation in OR  Informed Consent: I have reviewed the patients History and Physical, chart, labs and discussed the procedure including the risks, benefits and alternatives for the proposed anesthesia with the patient or authorized representative who has indicated his/her understanding and acceptance.     Dental advisory given  Plan Discussed  with: CRNA  Anesthesia Plan Comments: (Plan Full PPE use  Plan GETA D/w pt plan to give pRBCs -voiced understanding -WTP)        Anesthesia Quick Evaluation

## 2018-11-28 NOTE — Anesthesia Procedure Notes (Signed)
Procedure Name: Intubation Date/Time: 11/28/2018 10:38 AM Performed by: Andree Elk, Amy A, CRNA Pre-anesthesia Checklist: Patient identified, Patient being monitored, Timeout performed, Emergency Drugs available and Suction available Patient Re-evaluated:Patient Re-evaluated prior to induction Oxygen Delivery Method: Circle system utilized Preoxygenation: Pre-oxygenation with 100% oxygen Induction Type: IV induction Laryngoscope Size: Mac and 3 Grade View: Grade I Tube type: Oral Tube size: 7.0 mm Number of attempts: 1 Airway Equipment and Method: Stylet Placement Confirmation: ETT inserted through vocal cords under direct vision,  positive ETCO2 and breath sounds checked- equal and bilateral Secured at: 21 cm Tube secured with: Tape Dental Injury: Teeth and Oropharynx as per pre-operative assessment

## 2018-11-28 NOTE — Anesthesia Postprocedure Evaluation (Signed)
Anesthesia Post Note  Patient: Kimberly Black  Procedure(s) Performed: HYSTERECTOMY ABDOMINAL (N/A ) OPEN RIGHT SALPINGO OOPHORECTOMY (Right )  Patient location during evaluation: PACU Anesthesia Type: General Level of consciousness: awake and alert and oriented Pain management: pain level controlled Vital Signs Assessment: post-procedure vital signs reviewed and stable Respiratory status: spontaneous breathing Cardiovascular status: stable Postop Assessment: no apparent nausea or vomiting Anesthetic complications: no     Last Vitals:  Vitals:   11/28/18 1330 11/28/18 1345  BP: 111/62 109/62  Pulse: 75 77  Resp: 12 11  Temp:    SpO2: 96% 96%    Last Pain:  Vitals:   11/28/18 1352  TempSrc:   PainSc: 8                  ADAMS, AMY A

## 2018-11-28 NOTE — Transfer of Care (Signed)
Immediate Anesthesia Transfer of Care Note  Patient: Kimberly Black  Procedure(s) Performed: HYSTERECTOMY ABDOMINAL (N/A ) OPEN RIGHT SALPINGO OOPHORECTOMY (Right )  Patient Location: PACU  Anesthesia Type:General  Level of Consciousness: awake, oriented and patient cooperative  Airway & Oxygen Therapy: Patient Spontanous Breathing  Post-op Assessment: Report given to RN and Post -op Vital signs reviewed and stable  Post vital signs: Reviewed and stable  Last Vitals:  Vitals Value Taken Time  BP 104/65 11/28/18 1302  Temp    Pulse 83 11/28/18 1307  Resp 15 11/28/18 1307  SpO2 95 % 11/28/18 1307  Vitals shown include unvalidated device data.  Last Pain:  Vitals:   11/28/18 1015  TempSrc: Oral  PainSc:       Patients Stated Pain Goal: 8 (88/71/95 9747)  Complications: No apparent anesthesia complications

## 2018-11-28 NOTE — Op Note (Signed)
Preoperative diagnosis: 1.  34-week sized fibroid uterus with 1 myoma 23                                                         cm                                          2.  Anemia due to chronically heavy blood loss                                                  due to fibroids                                            3.  Uterine myoma degeneration, acute, due to                                                       recent 18-week consider termination   Postop diagnosis: Same as above  Procedure: Abdominal hysterectomy with right salpingo-oophorectomy  Surgeon:  Florian Buff, MD  Anesthesia: General endotracheal  Findings:   This patient's extended story begins on Oct 18, 2018 at which time she presented to our office and saw Derrek Monaco, NP the thought that her fibroids were growing as her stomach was getting bigger .  She also thought she was perimenopausal.  At the day of that visit her pregnancy test was positive and we performed an ultrasound which revealed a 16-week intrauterine pregnancy with multiple large fibroids 1 of which was 23 cm in size.        On 11/03/2018 at 17 weeks and 6 days she [facility] in [place] to have a pregnancy termination.     Unfortunately because of her huge fibroids they were unable to complete the termination at the [facility] and she was [facility] and admitted by Dr. Arlina Robes.  The patient was then given Cytotec which was successful in causing the delivery of the fetus however the placenta was retained and did not deliver with repetitive Cytotec dosing.  So on 11/05/2018    Dr. Vivien Rota    took the patient to the operating room and did a surgical D&C and remove the placenta.  The patient was given perioperative and postoperative antibiotics and was discharged home on the morning of 11/06/2018.         4 days later on 11/10/2018 I readmitted the  patient for fever and increasing abdominal pain.  On exam it really seemed like this was more of a degenerating fibroid picture as she was significantly tender over the large fibroids which were basically under her liver.  Her white count was elevated at 20,000 and she had  a temperature of 101.2 so I admitted her for post termination endometritis and she quickly defervesced but as expected her pain really did not improve.  Additionally her white blood cell count went down.    I discharged her home after 3 days of IV antibiotics and Cipro and Flagyl orally to take for the next 2 weeks with the plan to proceed shortly thereafter for an abdominal hysterectomy for management of the pain associated with degeneration of her leiomyomata.  When I saw her in the office a week after discharge she could still barely walk because of the pain from the fibroids in the upper abdomen.  Pathophysiology I believe is related to the estrogen associated with pregnancy caused rapid growth because of neovascularization and then subsequently when the pregnancy was terminated the estrogen levels dropped quickly which caused an acute degeneration and central necrosis of the fibroids leading to her pain and leukemoid reaction reflected in her elevated white count.  However even if she did had an endometritis has been adequately treated for that as well and this is the appropriate timing to go forward with definitive surgical management.  At the time of surgery today she had multiple fibroids the largest of which was posterior and extended all the way up to her liver.  The ovaries otherwise appeared to be normal and there was no abnormalities in the peritoneal cavity.  Even those these were quite large and 1 significantly large leiomyoma the did this did not appear to be consistent with a leiomyosarcoma picture although certainly that still within the realm of possibility as well as a cellular myoma or a borderline leiomyoma.       Description of operation: The patient was taken to the operating room placed in the supine position where she underwent general endotracheal anesthesia.  Per preoperative plan a unit of blood was hanging as we went back to the OR as her preoperative hemoglobin was 7.8 despite Depo-Provera and Feraheme injection when I admitted her for endometritis.  She was hemodynamically stable at the time of induction of anesthesia.  I then came in and examined the patient because I really wanted to try to avoid a vertical skin incision and after examining her abdomen under general anesthesia I felt there was enough mobility that I would be able to take it out through a transverse lower abdominal incision not a true Pfannenstiel but a high and large low transverse which I turned into a modified Churney incision.  She was prepped and draped in the usual sterile fashion.  A transverse skin incision was made at the level of the anterior superior iliac spines and the incision was carried laterally bilaterally about 4 cm away from each anterior superior iliac spine.  The fascia was incised and extended sharply the fascia was taken off the muscle superiorly and inferiorly without difficulty the muscles were divided and the peritoneal cavity was entered.   The above-noted anatomical findings were noted.  My intention was to avoid performing a myomectomy and really do everything I could to get the masses delivered through the abdominal wall prior to any significant vascular ligation in order to limit intraoperative blood loss.  I probably took about 20 to 30 minutes of time doing interrupted sutures utero-ovarian ligament and round ligament bilaterally without anything any incising to create as much diminished pulse pressure as I possibly could.  I then cut the rectus muscles transversely bilaterally to allow delivery of the mass. I then spent quite a bit of  time delivering the mass uterus through the abdominal wall incision.  I  then incised the peritoneum in the midline and took down the bladder flap.  I did 2 clamps and 2 pedicles above the level uterine vessels bilaterally.  I used 0 Monocryl in a CTX needle because of the size of the pedicles.    I was not able to preserve the right tube and ovary because it was basically engulf by the posterior myoma.  Was able to preserve the left tube and ovary however.  I then clamped the uterine vessels bilaterally and performed a 4 and aft suture ligation bilaterally.  I then took one pedicle down medial to the uterine vessels bilaterally that pedicle being clamped cut and transfixion suture ligated.  There was good hemostasis at this point considering the size of the mass.  I then amputated the uterus and posterior myoma from the cervix for ease and better visualization.  I then took several pedicles down the cervix laterally.  Each pedicle was clamped cut and transfixion suture ligated.  I then crossclamped the vagina and the cervix was removed vaginal angle sutures bilaterally with good hemostasis in the vagina was closed with interrupted figure-of-eight sutures.  Overall there was excellent hemostasis Arista was used because of the large tenuous cut surfaces but all the pedicles were found to be hemostatic.    The pelvis had already been vigorously irrigated.  Cemented blood loss was 600 cc which I was delighted that given the size of the mass  The muscles and peritoneum were then repaired and closed bilaterally due to creating a churning incision The fascia was closed using 0 Vicryl running running The subcutaneous tissue was made hemostatic and irrigated and it was reapproximated loosely using 0 plain gut The skin was closed using skin staples There was good hemostasis at this point Honeycomb dressing was placed  The patient was taken to recovery in good stable condition all counts were correct x3 She received a total of 2 units of packed red blood cells as was the preoperative  plan and I feel at this point should be about equivalent with her hemoglobin being approximately what it was when we started the surgery Her hemoglobin in the PACU was 94.35 but-year-old probably equilibrate lower but I do not plan to give any more blood at this time Will base that in her hemoglobin tomorrow morning Her urine output was approximately 250 cc of clear urine  Florian Buff, MD 11/28/2018 1:13 PM

## 2018-11-28 NOTE — H&P (Addendum)
Preoperative History and Physical  Kimberly Black is a 43 y.o. [phone] with No LMP recorded (lmp unknown). Patient is not pregnant she is s/p pregnancy termination in early June . admitted for a abdominal hysterectomy.  Has 34 week size fibroid uterus with increasing abdominal pain due to degeneration. Admitted for abdominal hysterectomy for management  PMH:      Past Medical History:  Diagnosis Date  . Acne   . Anemia 11/25/2013  . BV (bacterial vaginosis)   . Fibroids    uterine  . History of trichomoniasis   . Menorrhagia 11/08/2013  . Obesity   . Vaginal discharge 01/07/2015  . Vaginal odor 11/08/2013  PSH:       Past Surgical History:  Procedure Laterality Date  . DILATION AND EVACUATION N/A 11/05/2018   Procedure: DILATATION AND EVACUATION; Surgeon: Sloan Leiter, MD; Location: [facility] LD ORS; Service: Gynecology; Laterality: N/A;  . NO PAST SURGERIES    POb/GynH:          OB History     Gravida Para Term Preterm AB Living   4 2 1  2 1     SAB TAB Ectopic Multiple Live Births     2  0 1      SH:  Social History       Tobacco Use  . Smoking status: Never Smoker  . Smokeless tobacco: Never Used  Substance Use Topics  . Alcohol use: No  . Drug use: No  FH:       Family History  Problem Relation Age of Onset  . Hypertension Mother   . Diabetes Father   . Cancer Maternal Grandfather   . Asthma Son   . Cancer Maternal Aunt   Allergies: No Known Allergies  Medications:  Current Outpatient Medications:  . ciprofloxacin (CIPRO) 500 MG tablet, Take 1 tablet (500 mg total) by mouth 2 (two) times daily., Disp: 14 tablet, Rfl: 0  . HYDROcodone-acetaminophen (NORCO/VICODIN) 5-325 MG tablet, Take 1-2 tablets by mouth every 6 (six) hours as needed for moderate pain or severe pain., Disp: 30 tablet, Rfl: 0  . metroNIDAZOLE (FLAGYL) 500 MG tablet, Take 1 tablet (500 mg total) by mouth 2 (two) times daily., Disp: 14 tablet, Rfl: 0  Review of Systems:  Review of Systems   Constitutional: Negative for fever, chills, weight loss, malaise/fatigue and diaphoresis.  HENT: Negative for hearing loss, ear pain, nosebleeds, congestion, sore throat, neck pain, tinnitus and ear discharge.  Eyes: Negative for blurred vision, double vision, photophobia, pain, discharge and redness.  Respiratory: Negative for cough, hemoptysis, sputum production, shortness of breath, wheezing and stridor.  Cardiovascular: Negative for chest pain, palpitations, orthopnea, claudication, leg swelling and PND.  Gastrointestinal: Positive for abdominal pain. Negative for heartburn, nausea, vomiting, diarrhea, constipation, blood in stool and melena.  Genitourinary: Negative for dysuria, urgency, frequency, hematuria and flank pain.  Musculoskeletal: Negative for myalgias, back pain, joint pain and falls.  Skin: Negative for itching and rash.  Neurological: Negative for dizziness, tingling, tremors, sensory change, speech change, focal weakness, seizures, loss of consciousness, weakness and headaches.  Endo/Heme/Allergies: Negative for environmental allergies and polydipsia. Does not bruise/bleed easily.  Psychiatric/Behavioral: Negative for depression, suicidal ideas, hallucinations, memory loss and substance abuse. The patient is not nervous/anxious and does not have insomnia.  PHYSICAL EXAM:  Blood pressure 125/64, pulse 96, height 5\' 3"  (1.6 m), weight 227 lb 8 oz (103.2 kg).  Vitals reviewed.  Constitutional: She is oriented to person, place, and time. She  appears well-developed and well-nourished.  HENT:  Head: Normocephalic and atraumatic.  Right Ear: External ear normal.  Left Ear: External ear normal.  Nose: Nose normal.  Mouth/Throat: Oropharynx is clear and moist.  Eyes: Conjunctivae and EOM are normal. Pupils are equal, round, and reactive to light. Right eye exhibits no discharge. Left eye exhibits no discharge. No scleral icterus.  Neck: Normal range of motion. Neck supple. No  tracheal deviation present. No thyromegaly present.  Cardiovascular: Normal rate, regular rhythm, normal heart sounds and intact distal pulses. Exam reveals no gallop and no friction rub.  No murmur heard.  Respiratory: Effort normal and breath sounds normal. No respiratory distress. She has no wheezes. She has no rales. She exhibits no tenderness.  GI: Soft. Bowel sounds are normal. She exhibits no distension and no mass. There is tenderness. There is no rebound and no guarding.  Genitourinary:  Vulva is normal without lesions Vagina is pink moist without discharge Cervix normal in appearance and pap is normal Uterus is 34 weeks size, largest is 23 cm Adnexa is negative with normal sized ovaries by sonogram  Musculoskeletal: Normal range of motion. She exhibits no edema and no tenderness.  Neurological: She is alert and oriented to person, place, and time. She has normal reflexes. She displays normal reflexes. No cranial nerve deficit. She exhibits normal muscle tone. Coordination normal.  Skin: Skin is warm and dry. No rash noted. No erythema. No pallor.  Psychiatric: She has a normal mood and affect. Her behavior is normal. Judgment and thought content normal.  Labs:        Results for orders placed or performed during the hospital encounter of 11/09/18 (from the past 336 hour(s))  GC/Chlamydia probe amp ([place])not at [facility]   Collection Time: 11/09/18 12:00 AM  Result Value Ref Range   Chlamydia Negative    Neisseria gonorrhea Negative   Lipase, blood   Collection Time: 11/09/18 10:17 PM  Result Value Ref Range   Lipase 21 11 - 51 U/L  Comprehensive metabolic panel   Collection Time: 11/09/18 10:17 PM  Result Value Ref Range   Sodium 137 135 - 145 mmol/L   Potassium 3.0 (L) 3.5 - 5.1 mmol/L   Chloride 102 98 - 111 mmol/L   CO2 25 22 - 32 mmol/L   Glucose, Bld 111 (H) 70 - 99 mg/dL   BUN 5 (L) 6 - 20 mg/dL   Creatinine, Ser 0.48 0.44 - 1.00 mg/dL   Calcium 8.7 (L) 8.9 -  10.3 mg/dL   Total Protein 6.4 (L) 6.5 - 8.1 g/dL   Albumin 2.6 (L) 3.5 - 5.0 g/dL   AST 31 15 - 41 U/L   ALT 35 0 - 44 U/L   Alkaline Phosphatase 71 38 - 126 U/L   Total Bilirubin 0.1 (L) 0.3 - 1.2 mg/dL   GFR calc non Af Amer >60 >60 mL/min   GFR calc Af Amer >60 >60 mL/min   Anion gap 10 5 - 15  CBC   Collection Time: 11/09/18 10:17 PM  Result Value Ref Range   WBC 20.9 (H) 4.0 - 10.5 K/uL   RBC 3.45 (L) 3.87 - 5.11 MIL/uL   Hemoglobin 7.6 (L) 12.0 - 15.0 g/dL   HCT 25.0 (L) 36.0 - 46.0 %   MCV 72.5 (L) 80.0 - 100.0 fL   MCH 22.0 (L) 26.0 - 34.0 pg   MCHC 30.4 30.0 - 36.0 g/dL   RDW 16.9 (H) 11.5 - 15.5 %  Platelets 456 (H) 150 - 400 K/uL   nRBC 0.9 (H) 0.0 - 0.2 %  hCG, quantitative, pregnancy   Collection Time: 11/09/18 10:17 PM  Result Value Ref Range   hCG, Beta Chain, Quant, S 136 (H) <5 mIU/mL  Urinalysis, Routine w reflex microscopic   Collection Time: 11/09/18 10:31 PM  Result Value Ref Range   Color, Urine YELLOW YELLOW   APPearance HAZY (A) CLEAR   Specific Gravity, Urine 1.015 1.005 - 1.030   pH 7.0 5.0 - 8.0   Glucose, UA NEGATIVE NEGATIVE mg/dL   Hgb urine dipstick LARGE (A) NEGATIVE   Bilirubin Urine NEGATIVE NEGATIVE   Ketones, ur NEGATIVE NEGATIVE mg/dL   Protein, ur 100 (A) NEGATIVE mg/dL   Nitrite NEGATIVE NEGATIVE   Leukocytes,Ua MODERATE (A) NEGATIVE   RBC / HPF >50 (H) 0 - 5 RBC/hpf   WBC, UA 11-20 0 - 5 WBC/hpf   Bacteria, UA RARE (A) NONE SEEN   Squamous Epithelial / LPF 0-5 0 - 5   Mucus PRESENT   Blood culture (routine x 2)   Collection Time: 11/10/18 12:53 AM   Specimen: BLOOD RIGHT HAND  Result Value Ref Range   Specimen Description BLOOD RIGHT HAND    Special Requests      BOTTLES DRAWN AEROBIC AND ANAEROBIC Blood Culture results may not be optimal due to an excessive volume of blood received in culture bottles   Culture      NO GROWTH 5 DAYS  Performed at [facility], [address], [place], [place] [address]    Report  Status 11/15/2018 FINAL   Blood culture (routine x 2)   Collection Time: 11/10/18 12:53 AM   Specimen: BLOOD LEFT ARM  Result Value Ref Range   Specimen Description BLOOD LEFT ARM    Special Requests      BOTTLES DRAWN AEROBIC ONLY Blood Culture adequate volume   Culture      NO GROWTH 5 DAYS  Performed at [facility], [place] [address] [place],  [address]    Report Status 11/15/2018 FINAL   Lactic acid, plasma   Collection Time: 11/10/18 12:53 AM  Result Value Ref Range   Lactic Acid, Venous 1.0 0.5 - 1.9 mmol/L  Wet prep, genital   Collection Time: 11/10/18 1:40 AM   Specimen: Cervical/Vaginal swab; Genital  Result Value Ref Range   Yeast Wet Prep HPF POC NONE SEEN NONE SEEN   Trich, Wet Prep NONE SEEN NONE SEEN   Clue Cells Wet Prep HPF POC NONE SEEN NONE SEEN   WBC, Wet Prep HPF POC FEW (A) NONE SEEN   Sperm NONE SEEN   SARS Coronavirus 2   Collection Time: 11/10/18 1:46 AM  Result Value Ref Range   SARS Coronavirus 2 NOT DETECTED NOT DETECTED  CBC   Collection Time: 11/10/18 8:11 AM  Result Value Ref Range   WBC 20.8 (H) 4.0 - 10.5 K/uL   RBC 3.35 (L) 3.87 - 5.11 MIL/uL   Hemoglobin 7.4 (L) 12.0 - 15.0 g/dL   HCT 24.3 (L) 36.0 - 46.0 %   MCV 72.5 (L) 80.0 - 100.0 fL   MCH 22.1 (L) 26.0 - 34.0 pg   MCHC 30.5 30.0 - 36.0 g/dL   RDW 17.0 (H) 11.5 - 15.5 %   Platelets 427 (H) 150 - 400 K/uL   nRBC 0.7 (H) 0.0 - 0.2 %  CBC with Differential/Platelet   Collection Time: 11/11/18 8:29 AM  Result Value  Ref Range   WBC 20.5 (H) 4.0 - 10.5 K/uL   RBC 3.37 (L) 3.87 - 5.11 MIL/uL   Hemoglobin 7.4 (L) 12.0 - 15.0 g/dL   HCT 24.2 (L) 36.0 - 46.0 %   MCV 71.8 (L) 80.0 - 100.0 fL   MCH 22.0 (L) 26.0 - 34.0 pg   MCHC 30.6 30.0 - 36.0 g/dL   RDW 16.9 (H) 11.5 - 15.5 %   Platelets 526 (H) 150 - 400 K/uL   nRBC 1.1 (H) 0.0 - 0.2 %   Neutrophils Relative % 78 %   Neutro Abs 16.0 (H) 1.7 - 7.7 K/uL   Lymphocytes Relative 8 %   Lymphs Abs 1.7 0.7 - 4.0 K/uL    Monocytes Relative 8 %   Monocytes Absolute 1.7 (H) 0.1 - 1.0 K/uL   Eosinophils Relative 1 %   Eosinophils Absolute 0.2 0.0 - 0.5 K/uL   Basophils Relative 0 %   Basophils Absolute 0.0 0.0 - 0.1 K/uL   Immature Granulocytes 5 %   Abs Immature Granulocytes 0.97 (H) 0.00 - 0.07 K/uL  Results for orders placed or performed during the hospital encounter of 11/03/18 (from the past 336 hour(s))  CBC with Differential/Platelet   Collection Time: 11/03/18 4:56 PM  Result Value Ref Range   WBC 14.3 (H) 4.0 - 10.5 K/uL   RBC 4.57 3.87 - 5.11 MIL/uL   Hemoglobin 10.3 (L) 12.0 - 15.0 g/dL   HCT 32.7 (L) 36.0 - 46.0 %   MCV 71.6 (L) 80.0 - 100.0 fL   MCH 22.5 (L) 26.0 - 34.0 pg   MCHC 31.5 30.0 - 36.0 g/dL   RDW 17.2 (H) 11.5 - 15.5 %   Platelets 355 150 - 400 K/uL   nRBC 0.2 0.0 - 0.2 %   Neutrophils Relative % 82 %   Neutro Abs 11.7 (H) 1.7 - 7.7 K/uL   Lymphocytes Relative 10 %   Lymphs Abs 1.4 0.7 - 4.0 K/uL   Monocytes Relative 7 %   Monocytes Absolute 1.0 0.1 - 1.0 K/uL   Eosinophils Relative 0 %   Eosinophils Absolute 0.0 0.0 - 0.5 K/uL   Basophils Relative 0 %   Basophils Absolute 0.0 0.0 - 0.1 K/uL   Immature Granulocytes 1 %   Abs Immature Granulocytes 0.16 (H) 0.00 - 0.07 K/uL  Type and screen   Collection Time: 11/03/18 4:58 PM  Result Value Ref Range   ABO/RH(D) O POS    Antibody Screen NEG    Sample Expiration      11/06/2018,2359  Performed at [facility], [address], [place], [place] [address]   ABO/Rh   Collection Time: 11/03/18 4:58 PM  Result Value Ref Range   ABO/RH(D)      O POS  Performed at [facility] [address], [place], [place] [address]   SARS Coronavirus 2 (CEPHEID - Performed in [place] hospital lab), [facility] Order   Collection Time: 11/03/18 6:47 PM   Specimen: Nasopharyngeal Swab  Result Value Ref Range   SARS Coronavirus 2 NEGATIVE NEGATIVE  CBC   Collection Time: 11/05/18 3:19 PM  Result Value Ref Range   WBC 21.1  (H) 4.0 - 10.5 K/uL   RBC 3.96 3.87 - 5.11 MIL/uL   Hemoglobin 9.1 (L) 12.0 - 15.0 g/dL   HCT 28.4 (L) 36.0 - 46.0 %   MCV 71.7 (L) 80.0 - 100.0 fL   MCH 23.0 (L) 26.0 - 34.0 pg  MCHC 32.0 30.0 - 36.0 g/dL   RDW 17.2 (H) 11.5 - 15.5 %   Platelets 302 150 - 400 K/uL   nRBC 0.1 0.0 - 0.2 %  CBC   Collection Time: 11/06/18 5:47 AM  Result Value Ref Range   WBC 18.3 (H) 4.0 - 10.5 K/uL   RBC 3.53 (L) 3.87 - 5.11 MIL/uL   Hemoglobin 8.0 (L) 12.0 - 15.0 g/dL   HCT 25.6 (L) 36.0 - 46.0 %   MCV 72.5 (L) 80.0 - 100.0 fL   MCH 22.7 (L) 26.0 - 34.0 pg   MCHC 31.3 30.0 - 36.0 g/dL   RDW 17.2 (H) 11.5 - 15.5 %   Platelets 273 150 - 400 K/uL   nRBC 0.1 0.0 - 0.2 %   Results for orders placed or performed during the hospital encounter of 11/23/18 (from the past 168 hour(s))  CBC   Collection Time: 11/23/18 10:42 AM  Result Value Ref Range   WBC 14.7 (H) 4.0 - 10.5 K/uL   RBC 3.68 (L) 3.87 - 5.11 MIL/uL   Hemoglobin 7.8 (L) 12.0 - 15.0 g/dL   HCT 27.0 (L) 36.0 - 46.0 %   MCV 73.4 (L) 80.0 - 100.0 fL   MCH 21.2 (L) 26.0 - 34.0 pg   MCHC 28.9 (L) 30.0 - 36.0 g/dL   RDW 18.7 (H) 11.5 - 15.5 %   Platelets 777 (H) 150 - 400 K/uL   nRBC 0.3 (H) 0.0 - 0.2 %  Comprehensive metabolic panel   Collection Time: 11/23/18 10:42 AM  Result Value Ref Range   Sodium 139 135 - 145 mmol/L   Potassium 3.8 3.5 - 5.1 mmol/L   Chloride 101 98 - 111 mmol/L   CO2 26 22 - 32 mmol/L   Glucose, Bld 104 (H) 70 - 99 mg/dL   BUN 11 6 - 20 mg/dL   Creatinine, Ser 0.48 0.44 - 1.00 mg/dL   Calcium 9.2 8.9 - 10.3 mg/dL   Total Protein 7.6 6.5 - 8.1 g/dL   Albumin 3.4 (L) 3.5 - 5.0 g/dL   AST 11 (L) 15 - 41 U/L   ALT 18 0 - 44 U/L   Alkaline Phosphatase 95 38 - 126 U/L   Total Bilirubin 0.4 0.3 - 1.2 mg/dL   GFR calc non Af Amer >60 >60 mL/min   GFR calc Af Amer >60 >60 mL/min   Anion gap 12 5 - 15  Rapid HIV screen (HIV 1/2 Ab+Ag)   Collection Time: 11/23/18 10:42 AM  Result Value Ref Range   HIV-1 P24  Antigen - HIV24 NON REACTIVE NON REACTIVE   HIV 1/2 Antibodies NON REACTIVE NON REACTIVE   Interpretation (HIV Ag Ab)      A non reactive test result means that HIV 1 or HIV 2 antibodies and HIV 1 p24 antigen were not detected in the specimen.  Urinalysis, Routine w reflex microscopic   Collection Time: 11/23/18 10:42 AM  Result Value Ref Range   Color, Urine YELLOW YELLOW   APPearance HAZY (A) CLEAR   Specific Gravity, Urine 1.019 1.005 - 1.030   pH 5.0 5.0 - 8.0   Glucose, UA NEGATIVE NEGATIVE mg/dL   Hgb urine dipstick LARGE (A) NEGATIVE   Bilirubin Urine NEGATIVE NEGATIVE   Ketones, ur NEGATIVE NEGATIVE mg/dL   Protein, ur NEGATIVE NEGATIVE mg/dL   Nitrite NEGATIVE NEGATIVE   Leukocytes,Ua MODERATE (A) NEGATIVE   RBC / HPF 11-20 0 - 5 RBC/hpf   WBC, UA 21-50  0 - 5 WBC/hpf   Bacteria, UA RARE (A) NONE SEEN   Squamous Epithelial / LPF 6-10 0 - 5   Mucus PRESENT    Hyaline Casts, UA PRESENT    Uric Acid Crys, UA PRESENT   hCG, quantitative, pregnancy (serum - [facility])   Collection Time: 11/23/18 10:48 AM  Result Value Ref Range   hCG, Beta Chain, Quant, S 4 <5 mIU/mL  Results for orders placed or performed during the hospital encounter of 11/23/18 (from the past 168 hour(s))  Novel Coronavirus, NAA (hospital order; send-out to ref lab)   Collection Time: 11/23/18  6:59 AM   Specimen: Nasopharyngeal Swab; Respiratory  Result Value Ref Range   SARS-CoV-2, NAA NOT DETECTED NOT DETECTED   Coronavirus Source NASOPHARYNGEAL     Imaging Studies:  Imaging Results    Assessment:  34 week size fibroid uterus, largest is 23 cm  Abdominal pain due to degenerating fibroids  Plan:  Abdominal hysterectomy, with pre op type and cross, possible pre op transfusion, she received feraheme and depo provera in the hospital previously, hopefully hemoglobin will be in the 10 range but likely will require a transfusion  Scheduled 11/28/2018  Mertie Clause Eure  11/16/2018  10:29 AM

## 2018-11-29 ENCOUNTER — Encounter (HOSPITAL_COMMUNITY): Payer: Self-pay | Admitting: Obstetrics & Gynecology

## 2018-11-29 LAB — BASIC METABOLIC PANEL
Anion gap: 9 (ref 5–15)
BUN: 10 mg/dL (ref 6–20)
CO2: 24 mmol/L (ref 22–32)
Calcium: 8.9 mg/dL (ref 8.9–10.3)
Chloride: 105 mmol/L (ref 98–111)
Creatinine, Ser: 0.61 mg/dL (ref 0.44–1.00)
GFR calc Af Amer: >60 mL/min (ref >60)
GFR calc non Af Amer: >60 mL/min (ref >60)
Glucose, Bld: 113 mg/dL — ABNORMAL HIGH (ref 70–99)
Potassium: 4.5 mmol/L (ref 3.5–5.1)
Sodium: 138 mmol/L (ref 135–145)

## 2018-11-29 LAB — CBC
HCT: 29.4 % — ABNORMAL LOW (ref 36.0–46.0)
Hemoglobin: 9 g/dL — ABNORMAL LOW (ref 12.0–15.0)
MCH: 23.1 pg — ABNORMAL LOW (ref 26.0–34.0)
MCHC: 30.6 g/dL (ref 30.0–36.0)
MCV: 75.4 fL — ABNORMAL LOW (ref 80.0–100.0)
Platelets: 629 10*3/uL — ABNORMAL HIGH (ref 150–400)
RBC: 3.9 MIL/uL (ref 3.87–5.11)
RDW: 20.2 % — ABNORMAL HIGH (ref 11.5–15.5)
WBC: 12.8 10*3/uL — ABNORMAL HIGH (ref 4.0–10.5)
nRBC: 0.2 % (ref 0.0–0.2)

## 2018-11-29 MED ORDER — KETOROLAC TROMETHAMINE 10 MG PO TABS: 10.0000 mg | ORAL_TABLET | Freq: Three times a day (TID) | ORAL | 0 refills | Status: DC | PRN

## 2018-11-29 MED ORDER — OXYCODONE-ACETAMINOPHEN 7.5-325 MG PO TABS: 1.0000 | ORAL_TABLET | Freq: Four times a day (QID) | ORAL | 0 refills | Status: DC | PRN

## 2018-11-29 MED ORDER — ONDANSETRON HCL 8 MG PO TABS: 8.0000 mg | ORAL_TABLET | Freq: Four times a day (QID) | ORAL | 0 refills | Status: DC | PRN

## 2018-11-29 NOTE — Anesthesia Postprocedure Evaluation (Signed)
Anesthesia Post Note  Patient: Kimberly Black  Procedure(s) Performed: HYSTERECTOMY ABDOMINAL (N/A ) OPEN RIGHT SALPINGO OOPHORECTOMY (Right )  Patient location during evaluation: Nursing Unit Anesthesia Type: General Level of consciousness: awake and alert and oriented Pain management: pain level controlled Vital Signs Assessment: post-procedure vital signs reviewed and stable Respiratory status: spontaneous breathing Cardiovascular status: stable : Some nausea yesterday; relieved with Phenergan. Anesthetic complications: no     Last Vitals:  Vitals:   11/29/18 0042 11/29/18 0439  BP: 125/67 98/60  Pulse: 87 80  Resp: 17 17  Temp: 36.8 C 36.9 C  SpO2: 100% 99%    Last Pain:  Vitals:   11/29/18 0800  TempSrc:   PainSc: 5                  ADAMS, AMY A

## 2018-11-29 NOTE — Discharge Instructions (Signed)
Abdominal Hysterectomy, Care After °This sheet gives you information about how to care for yourself after your procedure. Your health care provider may also give you more specific instructions. If you have problems or questions, contact your health care provider. °What can I expect after the procedure? °After your procedure, it is common to have: °· Pain. °· Fatigue. °· Poor appetite. °· Less interest in sex. °· Vaginal bleeding and discharge. You may need to use a sanitary napkin after this procedure. °Follow these instructions at home: °Bathing °· Do not take baths, swim, or use a hot tub until your health care provider approves. Ask your health care provider if you can take showers. You may only be allowed to take sponge baths for bathing. °· Keep the bandage (dressing) dry until your health care provider says it can be removed. °Incision care ° °· Follow instructions from your health care provider about how to take care of your incision. Make sure you: °? Wash your hands with soap and water before you change your bandage (dressing). If soap and water are not available, use hand sanitizer. °? Change your dressing as told by your health care provider. °? Leave stitches (sutures), skin glue, or adhesive strips in place. These skin closures may need to stay in place for 2 weeks or longer. If adhesive strip edges start to loosen and curl up, you may trim the loose edges. Do not remove adhesive strips completely unless your health care provider tells you to do that. °· Check your incision area every day for signs of infection. Check for: °? Redness, swelling, or pain. °? Fluid or blood. °? Warmth. °? Pus or a bad smell. °Activity °· Do gentle, daily exercises as told by your health care provider. You may be told to take short walks every day and go farther each time. °· Do not lift anything that is heavier than 10 lb (4.5 kg), or the limit that your health care provider tells you, until he or she says that it is  safe. °· Do not drive or use heavy machinery while taking prescription pain medicine. °· Do not drive for 24 hours if you were given a medicine to help you relax (sedative). °· Follow your health care provider's instructions about exercise, driving, and general activities. Ask your health care provider what activities are safe for you. °Lifestyle °· Do not douche, use tampons, or have sex for at least 6 weeks or as told by your health care provider. °· Do not drink alcohol until your health care provider approves. °· Drink enough fluid to keep your urine clear or pale yellow. °· Try to have someone at home with you for the first 1-2 weeks to help. °· Do not use any products that contain nicotine or tobacco, such as cigarettes and e-cigarettes. These can delay healing. If you need help quitting, ask your health care provider. °General instructions °· Take over-the-counter and prescription medicines only as told by your health care provider. °· Do not take aspirin or ibuprofen. These medicines can cause bleeding. °· To prevent or treat constipation while you are taking prescription pain medicine, your health care provider may recommend that you: °? Drink enough fluid to keep your urine clear or pale yellow. °? Take over-the-counter or prescription medicines. °? Eat foods that are high in fiber, such as fresh fruits and vegetables, whole grains, and beans. °? Limit foods that are high in fat and processed sugars, such as fried and sweet foods. °· Keep all   follow-up visits as told by your health care provider. This is important. °Contact a health care provider if: °· You have chills or fever. °· You have redness, swelling, or pain around your incision. °· You have fluid or blood coming from your incision. °· Your incision feels warm to the touch. °· You have pus or a bad smell coming from your incision. °· Your incision breaks open. °· You feel dizzy or light-headed. °· You have pain or bleeding when you urinate. °· You  have persistent diarrhea. °· You have persistent nausea and vomiting. °· You have abnormal vaginal discharge. °· You have a rash. °· You have any type of abnormal reaction or you develop an allergy to your medicine. °· Your pain medicine does not help. °Get help right away if: °· You have a fever and your symptoms suddenly get worse. °· You have severe abdominal pain. °· You have shortness of breath. °· You faint. °· You have pain, swelling, or redness in your leg. °· You have heavy vaginal bleeding with blood clots. °Summary °· After your procedure, it is common to have pain, fatigue and vaginal discharge. °· Do not take baths, swim, or use a hot tub until your health care provider approves. Ask your health care provider if you can take showers. You may only be allowed to take sponge baths for bathing. °· Follow your health care provider's instructions about exercise, driving, and general activities. Ask your health care provider what activities are safe for you. °· Do not lift anything that is heavier than 10 lb (4.5 kg), or the limit that your health care provider tells you, until he or she says that it is safe. °· Try to have someone at home with you for the first 1-2 weeks to help. °This information is not intended to replace advice given to you by your health care provider. Make sure you discuss any questions you have with your health care provider. °Document Released: 12/03/2004 Document Revised: 06/19/2018 Document Reviewed: 05/04/2016 °Elsevier Patient Education © 2020 [employer] ° °

## 2018-11-29 NOTE — Discharge Summary (Signed)
Physician Discharge Summary  Patient ID: Kimberly Black MRN: [id number] DOB/AGE: [birth year] 43 y.o.  Admit date: 11/28/2018 Discharge date: 11/29/2018  Admission Diagnoses: S/p hysterectomy  Discharge Diagnoses:  Active Problems:   S/P hysterectomy   Discharged Condition: good  Hospital Course: unremarkable post op course  Consults:   Significant Diagnostic Studies: labs:   Treatments: surgery: TAH RSO  Discharge Exam: Blood pressure 98/60, pulse 80, temperature 98.4 F (36.9 C), temperature source Oral, resp. rate 17, height 5\' 3"  (1.6 m), weight 102.9 kg, SpO2 99 %. General appearance: alert, cooperative and no distress GI: soft, non-tender; bowel sounds normal; no masses,  no organomegaly Incision/Wound:clean dry intact  Disposition: Discharge disposition: 01-Home or Self Care       Discharge Instructions    Call MD for:  persistant nausea and vomiting   Complete by: As directed    Call MD for:  severe uncontrolled pain   Complete by: As directed    Call MD for:  temperature >100.4   Complete by: As directed    Diet - low sodium heart healthy   Complete by: As directed    Driving Restrictions   Complete by: As directed    No driving   Increase activity slowly   Complete by: As directed    Leave dressing on - Keep it clean, dry, and intact until clinic visit   Complete by: As directed    Lifting restrictions   Complete by: As directed    Nothing more than 10 pounds   Sexual Activity Restrictions   Complete by: As directed    No sex for 8 weeks     Allergies as of 11/29/2018   No Known Allergies     Medication List    STOP taking these medications   HYDROcodone-acetaminophen 5-325 MG tablet Commonly known as: NORCO/VICODIN     TAKE these medications   ketorolac 10 MG tablet Commonly known as: TORADOL Take 1 tablet (10 mg total) by mouth every 8 (eight) hours as needed.   ondansetron 8 MG tablet Commonly known as: ZOFRAN Take 1 tablet (8  mg total) by mouth every 6 (six) hours as needed for nausea.   oxyCODONE-acetaminophen 7.5-325 MG tablet Commonly known as: Percocet Take 1-2 tablets by mouth every 6 (six) hours as needed.      Follow-up Information    Florian Buff, MD Follow up on 12/06/2018.   Specialties: Obstetrics and Gynecology, Radiology Why: post op Contact information: [place] [address] [phone]           Signed: Florian Buff 11/29/2018, 12:52 PM

## 2018-11-29 NOTE — Addendum Note (Signed)
Addendum  created 11/29/18 0905 by Mickel Baas, CRNA   Clinical Note Signed

## 2018-12-01 LAB — TYPE AND SCREEN
ABO/RH(D): O POS
Antibody Screen: NEGATIVE
Unit division: 0
Unit division: 0

## 2018-12-01 LAB — BPAM RBC
Blood Product Expiration Date: 202007262359
Blood Product Expiration Date: 202007262359
ISSUE DATE / TIME: 202007011018
ISSUE DATE / TIME: 202007011139
Unit Type and Rh: 5100
Unit Type and Rh: 5100

## 2018-12-04 ENCOUNTER — Telehealth: Payer: Self-pay | Admitting: Obstetrics & Gynecology

## 2018-12-04 ENCOUNTER — Telehealth: Payer: Self-pay | Admitting: *Deleted

## 2018-12-04 MED ORDER — OXYCODONE-ACETAMINOPHEN 7.5-325 MG PO TABS: 1.0000 | ORAL_TABLET | Freq: Four times a day (QID) | ORAL | 0 refills | Status: DC | PRN

## 2018-12-04 NOTE — Telephone Encounter (Signed)
Refilled per request.

## 2018-12-04 NOTE — Telephone Encounter (Signed)
Patient called with questions about her medications, patient would like the oxycodone refilled, patient states she is still hurting. Please call patient [phone]

## 2018-12-04 NOTE — Telephone Encounter (Signed)
Pt is requesting a refill on Oxycodone. Pt is still hurting. Feels a pulling sensation. Please advise. Thanks!! Cobalt

## 2018-12-05 ENCOUNTER — Telehealth: Payer: Self-pay | Admitting: *Deleted

## 2018-12-05 NOTE — Telephone Encounter (Signed)
I made patient aware of appt info and covid restrictions

## 2018-12-06 ENCOUNTER — Other Ambulatory Visit: Payer: Self-pay

## 2018-12-06 ENCOUNTER — Encounter: Payer: Self-pay | Admitting: Obstetrics & Gynecology

## 2018-12-06 ENCOUNTER — Ambulatory Visit (INDEPENDENT_AMBULATORY_CARE_PROVIDER_SITE_OTHER): Payer: Medicare Other | Admitting: Obstetrics & Gynecology

## 2018-12-06 VITALS — BP 117/68 | HR 64 | Ht 63.0 in | Wt 212.0 lb

## 2018-12-06 DIAGNOSIS — Z9889 Other specified postprocedural states: Secondary | ICD-10-CM

## 2018-12-06 DIAGNOSIS — Z90710 Acquired absence of both cervix and uterus: Secondary | ICD-10-CM

## 2018-12-06 NOTE — Progress Notes (Signed)
  HPI: Patient returns for routine postoperative follow-up having undergone TAH RSO on 11/28/2018.  The patient's immediate postoperative recovery has been unremarkable. Since hospital discharge the patient reports no problems.   Current Outpatient Medications: oxyCODONE-acetaminophen (PERCOCET) 7.5-325 MG tablet, Take 1-2 tablets by mouth every 6 (six) hours as needed., Disp: 20 tablet, Rfl: 0 ketorolac (TORADOL) 10 MG tablet, Take 1 tablet (10 mg total) by mouth every 8 (eight) hours as needed. (Patient not taking: Reported on 12/06/2018), Disp: 15 tablet, Rfl: 0 ondansetron (ZOFRAN) 8 MG tablet, Take 1 tablet (8 mg total) by mouth every 6 (six) hours as needed for nausea. (Patient not taking: Reported on 12/06/2018), Disp: 20 tablet, Rfl: 0  No current facility-administered medications for this visit.     Blood pressure 117/68, pulse 64, height 5\' 3"  (1.6 m), weight 212 lb (96.2 kg).  Physical Exam: Incision clean dry intact Dressing removed and 1/2 of staples removed today, painted with gentian violet  Diagnostic Tests:   Pathology: benign  Impression: S/p TAH RSO  Plan: 1 week remove other staples  Follow up: 1  weeks  Florian Buff, MD

## 2018-12-10 ENCOUNTER — Other Ambulatory Visit: Payer: Self-pay | Admitting: Obstetrics & Gynecology

## 2018-12-10 ENCOUNTER — Telehealth: Payer: Self-pay | Admitting: Obstetrics & Gynecology

## 2018-12-10 NOTE — Telephone Encounter (Signed)
Pt is calling to see if Dr Elonda Husky is going to be able to refill her pain meds again. She picked up the one on the 7th and she has 2 pills left and needs another refill. SHe said Dr Elonda Husky asked her if she wanted another refill when she was here but at the time she was fine.  I advised her that Dr Elonda Husky was not in the office today and it may be tomorrow.

## 2018-12-11 NOTE — Telephone Encounter (Signed)
This is her last refill of oxycodone, unfortuantely she took so much pain meds prior to surgery she is somewhat tolerant but we have to diminsih the narcotics at this point, I have discussed this issue with the patient

## 2018-12-12 ENCOUNTER — Telehealth: Payer: Self-pay | Admitting: Obstetrics & Gynecology

## 2018-12-12 NOTE — Telephone Encounter (Signed)

## 2018-12-13 ENCOUNTER — Encounter: Payer: Self-pay | Admitting: Obstetrics & Gynecology

## 2018-12-13 ENCOUNTER — Other Ambulatory Visit: Payer: Self-pay

## 2018-12-13 ENCOUNTER — Ambulatory Visit (INDEPENDENT_AMBULATORY_CARE_PROVIDER_SITE_OTHER): Payer: Medicare Other | Admitting: Obstetrics & Gynecology

## 2018-12-13 VITALS — BP 131/71 | HR 62 | Ht 63.0 in | Wt 208.4 lb

## 2018-12-13 DIAGNOSIS — Z90710 Acquired absence of both cervix and uterus: Secondary | ICD-10-CM

## 2018-12-13 DIAGNOSIS — Z9889 Other specified postprocedural states: Secondary | ICD-10-CM

## 2018-12-13 MED ORDER — DOXYCYCLINE HYCLATE 100 MG PO TABS: 100.0000 mg | ORAL_TABLET | Freq: Two times a day (BID) | ORAL | 0 refills | Status: DC

## 2018-12-13 NOTE — Progress Notes (Signed)
  HPI: Patient returns for routine postoperative follow-up having undergone TAH RSO on 11/28/2018.  The patient's immediate postoperative recovery has been unremarkable. Since hospital discharge the patient reports incisional pain.   Current Outpatient Medications: oxyCODONE-acetaminophen (PERCOCET) 7.5-325 MG tablet, TAKE 1 OR 2 TABLETS BY MOUTH EVERY 6 HOURS AS NEEDED., Disp: 15 tablet, Rfl: 0 ketorolac (TORADOL) 10 MG tablet, Take 1 tablet (10 mg total) by mouth every 8 (eight) hours as needed. (Patient not taking: Reported on 12/06/2018), Disp: 15 tablet, Rfl: 0 ondansetron (ZOFRAN) 8 MG tablet, Take 1 tablet (8 mg total) by mouth every 6 (six) hours as needed for nausea. (Patient not taking: Reported on 12/06/2018), Disp: 20 tablet, Rfl: 0  No current facility-administered medications for this visit.     Blood pressure 131/71, pulse 62, height 5\' 3"  (1.6 m), weight 208 lb 6.4 oz (94.5 kg).  Physical Exam: Other 1/2 of the staples are removed Incision with a couple areas of non union but overall looks good Some moisture changes treated with H2O2 + gentian violet  Has a sebaceous cyst infected in her right labia minora  Diagnostic Tests:   Pathology: benign  Impression: S/p TAH RSO for exceedingly large leiomyomata  Plan: 10 days for incision re eval Doxycycline for infected right vulvar sebaceous cyst  Follow up: 10  days  Florian Buff, MD

## 2018-12-21 ENCOUNTER — Telehealth: Payer: Self-pay | Admitting: Obstetrics & Gynecology

## 2018-12-21 NOTE — Telephone Encounter (Signed)
Unable to reach pt with restrictions.  

## 2018-12-24 ENCOUNTER — Ambulatory Visit (INDEPENDENT_AMBULATORY_CARE_PROVIDER_SITE_OTHER): Payer: Medicare Other | Admitting: Obstetrics & Gynecology

## 2018-12-24 ENCOUNTER — Other Ambulatory Visit: Payer: Self-pay

## 2018-12-24 ENCOUNTER — Encounter: Payer: Self-pay | Admitting: Obstetrics & Gynecology

## 2018-12-24 VITALS — BP 128/74 | HR 85 | Ht 63.0 in | Wt 207.5 lb

## 2018-12-24 DIAGNOSIS — Z90710 Acquired absence of both cervix and uterus: Secondary | ICD-10-CM

## 2018-12-24 NOTE — Progress Notes (Signed)
  HPI: Patient returns for routine postoperative follow-up having undergone TAH RSO on 11/28/2018.  The patient's immediate postoperative recovery has been unremarkable. Since hospital discharge the patient reports no problems.   Current Outpatient Medications: oxyCODONE-acetaminophen (PERCOCET) 7.5-325 MG tablet, TAKE 1 OR 2 TABLETS BY MOUTH EVERY 6 HOURS AS NEEDED., Disp: 15 tablet, Rfl: 0  No current facility-administered medications for this visit.     Blood pressure 128/74, pulse 85, height 5\' 3"  (1.6 m), weight 207 lb 8 oz (94.1 kg).  Physical Exam: Incision clean dry intact  Diagnostic Tests:   Pathology: benign  Impression: S/p TAH RSO  Plan: No sex for 4 weeks   Follow up: 1  years  Florian Buff, MD

## 2019-01-03 ENCOUNTER — Telehealth: Payer: Self-pay | Admitting: Adult Health

## 2019-01-03 NOTE — Telephone Encounter (Signed)
What kind of Vitamin  does she need to take low iron. Please call and advisejan

## 2019-01-03 NOTE — Telephone Encounter (Signed)
Pt has no VM

## 2019-01-08 NOTE — Telephone Encounter (Signed)
Pts hemoglobin was 9. She wanted to know if she needs to take an iron supplement?

## 2019-01-08 NOTE — Telephone Encounter (Signed)
No she got IV iron and it will take some time but her hemoglobin will climb now that her uterus is out

## 2019-01-08 NOTE — Telephone Encounter (Signed)
Pt returning call. Please call home number.

## 2019-04-19 ENCOUNTER — Other Ambulatory Visit: Payer: Self-pay | Admitting: Obstetrics & Gynecology

## 2019-06-26 ENCOUNTER — Other Ambulatory Visit: Payer: Self-pay | Admitting: Adult Health

## 2019-07-29 ENCOUNTER — Other Ambulatory Visit: Payer: Self-pay | Admitting: Adult Health

## 2019-08-19 ENCOUNTER — Other Ambulatory Visit: Payer: Self-pay | Admitting: Adult Health

## 2019-09-24 ENCOUNTER — Other Ambulatory Visit: Payer: Self-pay | Admitting: Adult Health

## 2019-11-21 ENCOUNTER — Other Ambulatory Visit: Payer: Self-pay | Admitting: Adult Health

## 2019-11-26 ENCOUNTER — Other Ambulatory Visit: Payer: Self-pay | Admitting: Obstetrics & Gynecology

## 2019-12-13 ENCOUNTER — Other Ambulatory Visit: Payer: Self-pay | Admitting: Adult Health

## 2019-12-23 ENCOUNTER — Ambulatory Visit (INDEPENDENT_AMBULATORY_CARE_PROVIDER_SITE_OTHER): Payer: Medicare Other | Admitting: Adult Health

## 2019-12-23 ENCOUNTER — Encounter: Payer: Self-pay | Admitting: Adult Health

## 2019-12-23 VITALS — BP 131/83 | HR 73 | Ht 63.0 in | Wt 224.0 lb

## 2019-12-23 DIAGNOSIS — N898 Other specified noninflammatory disorders of vagina: Secondary | ICD-10-CM | POA: Diagnosis not present

## 2019-12-23 NOTE — Progress Notes (Addendum)
  Subjective:     Patient ID: Kimberly Black, female   DOB: 30-Mar-1976, 44 y.o.   MRN: [id number]  HPI Kimberly Black is a 44 year old black female,single, sp TAH RSO 7/20/2020in complaining of vaginal irritation, with discharge and odor for about a week.   Review of Systems Has vaginal discharge with irritation and odor for about a week No new partners or products Reviewed past medical,surgical, social and family history. Reviewed medications and allergies.     Objective:   Physical Exam BP (!) 131/83 (BP Location: Left Arm, Patient Position: Sitting, Cuff Size: Normal)   Pulse 73   Ht 5\' 3"  (1.6 m)   Wt (!) 224 lb (101.6 kg)   LMP  (LMP Unknown) Comment: last period in Feb.   BMI 39.68 kg/m  Skin warm and dry.Pelvic: external genitalia is normal in appearance no lesions, vagina: white discharge without odor,urethra has no lesions or masses noted, cervix and uterus are absent, adnexa: no masses or tenderness noted. Bladder is non tender. Nuswab obtained Examination chaperoned by Diona Fanti CMA.  [facility] - 12/23/19 1047      Pregnancy Intention Screening   Does the patient want to become pregnant in the next year? N/A    Does the patient's partner want to become pregnant in the next year? N/A    Would the patient like to discuss contraceptive options today? N/A      Contraception Wrap Up   Contraception Counseling Provided No             Assessment:     1. Vaginal discharge nuswab sent, will talk when results back   2. Vaginal irritation nuswab sent   3. Vaginal odor nuswab sent    Plan:     Follow up prn

## 2019-12-25 ENCOUNTER — Telehealth: Payer: Self-pay | Admitting: Adult Health

## 2019-12-25 LAB — NUSWAB VAGINITIS PLUS (VG+)
BVAB 2: HIGH Score — AB
Candida albicans, NAA: NEGATIVE
Candida glabrata, NAA: NEGATIVE
Chlamydia trachomatis, NAA: NEGATIVE
Megasphaera 1: HIGH Score — AB
Neisseria gonorrhoeae, NAA: NEGATIVE
Trich vag by NAA: NEGATIVE

## 2019-12-25 MED ORDER — METRONIDAZOLE 500 MG PO TABS: 500.0000 mg | ORAL_TABLET | Freq: Two times a day (BID) | ORAL | 0 refills | Status: DC

## 2019-12-25 NOTE — Telephone Encounter (Signed)
Pt aware that nuswab +BV will rx flagyl and she says breast leaks milk if squeezed,so make appt to be check but check labs before exam, TSH and prolactin

## 2020-01-07 ENCOUNTER — Encounter: Payer: Self-pay | Admitting: Adult Health

## 2020-01-07 ENCOUNTER — Other Ambulatory Visit (HOSPITAL_COMMUNITY)
Admission: RE | Admit: 2020-01-07 | Discharge: 2020-01-07 | Disposition: A | Discharge status: Home / Self Care | Payer: Medicare Other | Source: Ambulatory Visit | Attending: Adult Health | Admitting: Adult Health

## 2020-01-07 ENCOUNTER — Other Ambulatory Visit: Payer: Self-pay | Admitting: Adult Health

## 2020-01-07 ENCOUNTER — Ambulatory Visit (INDEPENDENT_AMBULATORY_CARE_PROVIDER_SITE_OTHER): Payer: Medicare Other | Admitting: Adult Health

## 2020-01-07 VITALS — BP 125/79 | HR 85 | Ht 63.0 in | Wt 225.0 lb

## 2020-01-07 DIAGNOSIS — N898 Other specified noninflammatory disorders of vagina: Secondary | ICD-10-CM

## 2020-01-07 DIAGNOSIS — N6452 Nipple discharge: Secondary | ICD-10-CM | POA: Insufficient documentation

## 2020-01-07 NOTE — Progress Notes (Signed)
Patient ID: Kimberly Black, female   DOB: 19-Dec-1975, 44 y.o.   MRN: [id number] History of Present Illness:  Kimberly Black is a 44 year old black female,in to have breast checked, will leak what looks like milk, and recheck vaginal discharge, recently treated for BV.She is sp hysterectomy. Had TSH and prolactin draw before exam.  Current Medications, Allergies, Past Medical History, Past Surgical History, Family History and Social History were reviewed in [employer] record.     Review of Systems: +nipple discharge, milky  +vaginal discharge    Physical Exam:BP 125/79 (BP Location: Left Arm, Patient Position: Sitting, Cuff Size: Normal)   Pulse 85   Ht 5\' 3"  (1.6 m)   Wt 225 lb (102.1 kg)   LMP  (LMP Unknown) Comment: last period in Feb.   BMI 39.86 kg/m  General:  Well developed, well nourished, no acute distress Skin:  Warm and dry Breast:  No dominant palpable mass, retraction, or nipple discharge, on the left, no mass or retraction on the right but has milky nipple discharge from several sites when squeezed, placed discharge on slide to air dry. Pelvic:  External genitalia is normal in appearance, no lesions.  The vagina is normal in appearance,has white creamy discharge without odor. Urethra has no lesions or masses. The cervix and uterus are absent. No adnexal masses or tenderness noted.Bladder is non tender, no masses felt.CV swab obtained. Extremities/musculoskeletal:  No swelling or varicosities noted, no clubbing or cyanosis Psych:  No mood changes, alert and cooperative,seems happy Examination chaperoned by Kimberly Pupa LPN  Impression and Plan: 1. Nipple discharge Check TSH and Prolactin Breast discharge slide sent to cytology Scheduled bilateral diagnostic mammogram and [place] for 8/30 at 9:40 am at [facility], they may call her to move appt up, she is aware  Will talk when results back.  2. Vaginal discharge CV swab sent  Follow up prn

## 2020-01-07 NOTE — Addendum Note (Signed)
Addended by: Linton Rump on: 01/07/2020 11:08 AM   Modules accepted: Orders

## 2020-01-08 ENCOUNTER — Telehealth: Payer: Self-pay | Admitting: Adult Health

## 2020-01-08 DIAGNOSIS — R7989 Other specified abnormal findings of blood chemistry: Secondary | ICD-10-CM

## 2020-01-08 LAB — TSH: TSH: 0.857 u[IU]/mL (ref 0.450–4.500)

## 2020-01-08 LAB — PROLACTIN: Prolactin: 35 ng/mL — ABNORMAL HIGH (ref 4.8–23.3)

## 2020-01-08 LAB — CERVICOVAGINAL ANCILLARY ONLY
Bacterial Vaginitis (gardnerella): NEGATIVE
Candida Glabrata: NEGATIVE
Candida Vaginitis: NEGATIVE
Chlamydia: NEGATIVE
Comment: NEGATIVE
Comment: NEGATIVE
Comment: NEGATIVE
Comment: NEGATIVE
Comment: NEGATIVE
Comment: NORMAL
Neisseria Gonorrhea: NEGATIVE
Trichomonas: NEGATIVE

## 2020-01-08 LAB — CYTOLOGY - NON PAP

## 2020-01-08 NOTE — Telephone Encounter (Signed)
Pt aware that TSH is normal and prolactin slightly elevated at 35, recheck prolactin fasting, get mammogram 8/31

## 2020-01-09 ENCOUNTER — Telehealth: Payer: Self-pay | Admitting: Adult Health

## 2020-01-09 NOTE — Telephone Encounter (Signed)
Pt aware that vaginal swab all negative and slid sent on nipple discharge showed no malignancy. She can try rephresh if desired for vaginal pH

## 2020-01-28 ENCOUNTER — Ambulatory Visit (HOSPITAL_COMMUNITY): Payer: Medicare Other

## 2020-01-28 ENCOUNTER — Ambulatory Visit (HOSPITAL_COMMUNITY): Admission: RE | Admit: 2020-01-28 | Payer: Medicare Other | Source: Ambulatory Visit

## 2020-03-31 ENCOUNTER — Other Ambulatory Visit: Payer: Self-pay | Admitting: Adult Health

## 2020-04-17 ENCOUNTER — Other Ambulatory Visit: Payer: Self-pay | Admitting: Adult Health

## 2020-06-29 ENCOUNTER — Other Ambulatory Visit: Payer: Self-pay | Admitting: Adult Health

## 2020-07-23 ENCOUNTER — Ambulatory Visit: Payer: Medicare Other | Admitting: Internal Medicine

## 2020-08-04 ENCOUNTER — Ambulatory Visit: Payer: Medicare Other | Admitting: Adult Health

## 2020-08-18 ENCOUNTER — Other Ambulatory Visit: Payer: Self-pay

## 2020-08-18 ENCOUNTER — Other Ambulatory Visit (HOSPITAL_COMMUNITY)
Admission: RE | Admit: 2020-08-18 | Discharge: 2020-08-18 | Disposition: A | Discharge status: Home / Self Care | Payer: Medicare Other | Source: Ambulatory Visit | Attending: Adult Health | Admitting: Adult Health

## 2020-08-18 ENCOUNTER — Ambulatory Visit (INDEPENDENT_AMBULATORY_CARE_PROVIDER_SITE_OTHER): Payer: Medicare Other | Admitting: Adult Health

## 2020-08-18 ENCOUNTER — Encounter: Payer: Self-pay | Admitting: Adult Health

## 2020-08-18 VITALS — BP 128/86 | HR 76 | Ht 63.0 in | Wt 225.8 lb

## 2020-08-18 DIAGNOSIS — N898 Other specified noninflammatory disorders of vagina: Secondary | ICD-10-CM | POA: Insufficient documentation

## 2020-08-18 DIAGNOSIS — Z90710 Acquired absence of both cervix and uterus: Secondary | ICD-10-CM | POA: Diagnosis not present

## 2020-08-18 DIAGNOSIS — B9689 Other specified bacterial agents as the cause of diseases classified elsewhere: Secondary | ICD-10-CM | POA: Diagnosis not present

## 2020-08-18 DIAGNOSIS — L709 Acne, unspecified: Secondary | ICD-10-CM

## 2020-08-18 DIAGNOSIS — N760 Acute vaginitis: Secondary | ICD-10-CM | POA: Diagnosis not present

## 2020-08-18 LAB — POCT WET PREP (WET MOUNT)
Clue Cells Wet Prep Whiff POC: POSITIVE
WBC, Wet Prep HPF POC: POSITIVE

## 2020-08-18 MED ORDER — METRONIDAZOLE 500 MG PO TABS: 500.0000 mg | ORAL_TABLET | Freq: Two times a day (BID) | ORAL | 1 refills | Status: DC

## 2020-08-18 MED ORDER — DOXYCYCLINE HYCLATE 100 MG PO TABS: 100.0000 mg | ORAL_TABLET | Freq: Every day | ORAL | 3 refills | Status: DC

## 2020-08-18 NOTE — Progress Notes (Signed)
  Subjective:     Patient ID: Kimberly Black, female   DOB: [birth year], 45 y.o.   MRN: [id number]  HPI Kimberly Black is a 45 year old black female,single, sp hysterectomy in complaining of vaginal discharge with odor and some itching.  Review of Systems +vaginal discharge with odor and itching Reviewed past medical,surgical, social and family history. Reviewed medications and allergies.     Objective:   Physical Exam BP 128/86 (BP Location: Right Arm, Patient Position: Sitting, Cuff Size: Large)   Pulse 76   Ht 5\' 3"  (1.6 m)   Wt 225 lb 12.8 oz (102.4 kg)   LMP  (LMP Unknown) Comment: last period in Feb.   BMI 40.00 kg/m  Skin warm and dry.Has some acne on face too, under mask area.  Pelvic: external genitalia is normal in appearance no lesions, vagina: white discharge with odor,urethra has no lesions or masses noted, cervix and uterus are absent, adnexa: no masses or tenderness noted. Bladder is non tender and no masses felt. Wet prep: + for clue cells and +WBCs. CV swab obtained.  [facility] - 08/18/20 1140      Pregnancy Intention Screening   Does the patient want to become pregnant in the next year? No    Does the patient's partner want to become pregnant in the next year? No    Would the patient like to discuss contraceptive options today? No      Contraception Wrap Up   Current Method Female Sterilization   hyst   End Method Female Sterilization   hyst   Contraception Counseling Provided No         Examination chaperoned by Tinnie Gens NP student     Assessment:     1. Vaginal discharge CV swab sent for GC/CHL,trich ,BV and yeast  2. Vaginal odor CV swab sent   3. S/P hysterectomy  4. BV (bacterial vaginosis) Will rx flagyl Meds ordered this encounter  Medications  . metroNIDAZOLE (FLAGYL) 500 MG tablet    Sig: Take 1 tablet (500 mg total) by mouth 2 (two) times daily.    Dispense:  14 tablet    Refill:  1    Order Specific Question:   Supervising Provider     Answer:   Elonda Husky, LUTHER H [[id number]]  . doxycycline (VIBRA-TABS) 100 MG tablet    Sig: Take 1 tablet (100 mg total) by mouth daily.    Dispense:  30 tablet    Refill:  3    Order Specific Question:   Supervising Provider    Answer:   Elonda Husky, LUTHER H [[id number]]    5. Acne, unspecified acne type Will rx doxycycline 100 mg 1 daily Sleep with hair up    Plan:    She declines HIV today  Follow up prn

## 2020-08-19 ENCOUNTER — Telehealth: Payer: Self-pay

## 2020-08-19 LAB — CERVICOVAGINAL ANCILLARY ONLY
Bacterial Vaginitis (gardnerella): POSITIVE — AB
Candida Glabrata: NEGATIVE
Candida Vaginitis: NEGATIVE
Chlamydia: NEGATIVE
Comment: NEGATIVE
Comment: NEGATIVE
Comment: NEGATIVE
Comment: NEGATIVE
Comment: NEGATIVE
Comment: NORMAL
Neisseria Gonorrhea: NEGATIVE
Trichomonas: NEGATIVE

## 2020-08-19 NOTE — Telephone Encounter (Signed)
Called pt to inform her of test results. Pt confirmed understanding.

## 2020-08-19 NOTE — Telephone Encounter (Signed)
-----   Message from Estill Dooms, NP sent at 08/19/2020  1:21 PM EDT ----- Let pt know that vaginal swabs just +BV and was treated for that at visit

## 2020-08-25 ENCOUNTER — Telehealth: Payer: Self-pay | Admitting: Adult Health

## 2020-08-25 MED ORDER — FLUCONAZOLE 150 MG PO TABS: ORAL_TABLET | ORAL | 1 refills | Status: DC

## 2020-08-25 NOTE — Telephone Encounter (Signed)
Patient wants a call back from a nurse to discuss a concern (patient didn't want to clarify the reason). Clinical staff will follow up with patient.

## 2020-08-25 NOTE — Telephone Encounter (Signed)
Will rx diflucan  

## 2020-08-25 NOTE — Telephone Encounter (Signed)
Pt continues to have vaginal discharge and irritation. + itching. Pt took all med for BV. What do you advise now? Thanks!! Gilliam

## 2020-10-01 ENCOUNTER — Telehealth: Payer: Self-pay | Admitting: Adult Health

## 2020-10-01 NOTE — Telephone Encounter (Signed)
Pt stated at her last visit Kimberly Black prescribed her medication for her acne(tablets), she was told that if she wanted to try the cream for her acne to call and let us know she wants that sent to the pharmacy.  Pt is requesting cream be sent to the pharmacy.

## 2020-10-02 MED ORDER — CLINDAMYCIN PHOS-BENZOYL PEROX 1-5 % EX GEL: CUTANEOUS | 0 refills | Status: DC

## 2020-10-02 NOTE — Telephone Encounter (Signed)
Will rx benzaclin gel

## 2020-10-12 ENCOUNTER — Other Ambulatory Visit: Payer: Self-pay

## 2020-10-12 ENCOUNTER — Ambulatory Visit (INDEPENDENT_AMBULATORY_CARE_PROVIDER_SITE_OTHER): Payer: Medicare Other | Admitting: Adult Health

## 2020-10-12 ENCOUNTER — Other Ambulatory Visit (HOSPITAL_COMMUNITY)
Admission: RE | Admit: 2020-10-12 | Discharge: 2020-10-12 | Disposition: A | Discharge status: Home / Self Care | Payer: Medicare Other | Source: Ambulatory Visit | Attending: Adult Health | Admitting: Adult Health

## 2020-10-12 ENCOUNTER — Encounter: Payer: Self-pay | Admitting: Adult Health

## 2020-10-12 VITALS — BP 124/74 | HR 71 | Ht 63.0 in | Wt 225.0 lb

## 2020-10-12 DIAGNOSIS — N898 Other specified noninflammatory disorders of vagina: Secondary | ICD-10-CM | POA: Insufficient documentation

## 2020-10-12 MED ORDER — TRIAMCINOLONE ACETONIDE 0.5 % EX OINT: 1.0000 "application " | TOPICAL_OINTMENT | Freq: Two times a day (BID) | CUTANEOUS | 0 refills | Status: DC

## 2020-10-12 MED ORDER — NYSTATIN 100000 UNIT/GM EX OINT: 1.0000 "application " | TOPICAL_OINTMENT | Freq: Two times a day (BID) | CUTANEOUS | 0 refills | Status: DC

## 2020-10-12 MED ORDER — FLUCONAZOLE 150 MG PO TABS: ORAL_TABLET | ORAL | 1 refills | Status: DC

## 2020-10-12 NOTE — Progress Notes (Signed)
  Subjective:     Patient ID: Kimberly Black, female   DOB: [birth year], 45 y.o.   MRN: [id number]  HPI Kimberly Black is a 45 year old white female,single, sp hysteerectomy in complaining of vaginl irritation, in and outside, used new product.   Review of Systems Has vaginal irritation, used new product No sex lately Reviewed past medical,surgical, social and family history. Reviewed medications and allergies.     Objective:   Physical Exam BP 124/74 (BP Location: Right Arm, Patient Position: Sitting, Cuff Size: Large)   Pulse 71   Ht 5\' 3"  (1.6 m)   Wt 225 lb (102.1 kg)   LMP  (LMP Unknown) Comment: last period in Feb.   BMI 39.86 kg/m   Skin warm and dry.Pelvic: external genitalia is normal in appearance no lesions, has white discharge in creases, vagina: white discharge without odor,urethra has no lesions or masses noted, cervix and uterus are absent,adnexa: no masses or tenderness noted. Bladder is non tender and no masses felt.  CV swab obtained.   Upstream - 10/12/20 1035      Pregnancy Intention Screening   Does the patient want to become pregnant in the next year? No    Does the patient's partner want to become pregnant in the next year? No    Would the patient like to discuss contraceptive options today? No      Contraception Wrap Up   Current Method --   hyst   End Method --   hyst   Contraception Counseling Provided No         Examination chaperoned by Estill Bamberg LPN    Assessment:     1. Vaginal irritation CV swab sent Will rx diflucan, nystatin, and triamcinolone ointment, mixed together  Wear lose panties Use bar soap, not shower gels   Meds ordered this encounter  Medications  . fluconazole (DIFLUCAN) 150 MG tablet    Sig: Take 1 now and 1 in 3 days    Dispense:  2 tablet    Refill:  1    Order Specific Question:   Supervising Provider    Answer:   EURE, LUTHER H [[id number]]  . triamcinolone ointment (KENALOG) 0.5 %    Sig: Apply 1 application topically 2  (two) times daily.    Dispense:  30 g    Refill:  0    Order Specific Question:   Supervising Provider    Answer:   Elonda Husky, LUTHER H [[id number]]  . nystatin ointment (MYCOSTATIN)    Sig: Apply 1 application topically 2 (two) times daily.    Dispense:  30 g    Refill:  0    Order Specific Question:   Supervising Provider    Answer:   Elonda Husky, LUTHER H [[id number]]     2. Vaginal discharge CV swab sent for GC/CHL,trich,BV and yeast      Plan:     Follow up prn

## 2020-10-13 ENCOUNTER — Telehealth: Payer: Self-pay | Admitting: Adult Health

## 2020-10-13 LAB — CERVICOVAGINAL ANCILLARY ONLY
Bacterial Vaginitis (gardnerella): POSITIVE — AB
Candida Glabrata: NEGATIVE
Candida Vaginitis: POSITIVE — AB
Chlamydia: NEGATIVE
Comment: NEGATIVE
Comment: NEGATIVE
Comment: NEGATIVE
Comment: NEGATIVE
Comment: NEGATIVE
Comment: NORMAL
Neisseria Gonorrhea: NEGATIVE
Trichomonas: NEGATIVE

## 2020-10-13 MED ORDER — METRONIDAZOLE 500 MG PO TABS: 500.0000 mg | ORAL_TABLET | Freq: Two times a day (BID) | ORAL | 1 refills | Status: DC

## 2020-10-13 NOTE — Telephone Encounter (Signed)
Pt informed vaginal swab +BV and yeast, has meds for yeast, will refill flagyl

## 2020-11-04 DIAGNOSIS — L718 Other rosacea: Secondary | ICD-10-CM | POA: Diagnosis not present

## 2020-11-04 DIAGNOSIS — L218 Other seborrheic dermatitis: Secondary | ICD-10-CM | POA: Diagnosis not present

## 2020-11-13 ENCOUNTER — Other Ambulatory Visit: Payer: Self-pay | Admitting: Adult Health

## 2021-01-12 ENCOUNTER — Other Ambulatory Visit (HOSPITAL_COMMUNITY)
Admission: RE | Admit: 2021-01-12 | Discharge: 2021-01-12 | Disposition: A | Discharge status: Home / Self Care | Payer: Medicare Other | Source: Ambulatory Visit | Attending: Obstetrics & Gynecology | Admitting: Obstetrics & Gynecology

## 2021-01-12 ENCOUNTER — Other Ambulatory Visit: Payer: Self-pay

## 2021-01-12 ENCOUNTER — Other Ambulatory Visit (INDEPENDENT_AMBULATORY_CARE_PROVIDER_SITE_OTHER): Payer: Medicare Other

## 2021-01-12 DIAGNOSIS — N898 Other specified noninflammatory disorders of vagina: Secondary | ICD-10-CM | POA: Diagnosis not present

## 2021-01-12 NOTE — Progress Notes (Addendum)
   NURSE VISIT- VAGINITIS/STD  SUBJECTIVE:  Kimberly Black is a 45 y.o. [phone] GYN patientfemale here for a vaginal swab for vaginitis screening, STD screen.  She reports the following symptoms: discharge described as clear and malodorous, odor, pain, and vulvar itching for 2 days. Denies abnormal vaginal bleeding, significant pelvic pain, fever, or UTI symptoms.  OBJECTIVE:  LMP  (LMP Unknown) Comment: last period in Feb.   Appears well, in no apparent distress  ASSESSMENT: Vaginal swab for  vaginitis & STD screening  PLAN: Self-collected vaginal probe for Gonorrhea, Chlamydia, Trichomonas, Bacterial Vaginosis, Yeast sent to lab Treatment: to be determined once results are received Follow-up as needed if symptoms persist/worsen, or new symptoms develop  Kimberly Black  01/12/2021 9:25 AM   Chart reviewed for nurse visit. Agree with plan of care. *per pt call son's number and leave message w/ results* Roma Schanz, CNM 01/12/2021 11:14 AM

## 2021-01-13 LAB — CERVICOVAGINAL ANCILLARY ONLY
Bacterial Vaginitis (gardnerella): POSITIVE — AB
Candida Glabrata: NEGATIVE
Candida Vaginitis: NEGATIVE
Chlamydia: NEGATIVE
Comment: NEGATIVE
Comment: NEGATIVE
Comment: NEGATIVE
Comment: NEGATIVE
Comment: NEGATIVE
Comment: NORMAL
Neisseria Gonorrhea: NEGATIVE
Trichomonas: NEGATIVE

## 2021-01-14 ENCOUNTER — Telehealth: Payer: Self-pay | Admitting: Adult Health

## 2021-01-14 ENCOUNTER — Other Ambulatory Visit: Payer: Self-pay | Admitting: Women's Health

## 2021-01-14 MED ORDER — METRONIDAZOLE 500 MG PO TABS: 500.0000 mg | ORAL_TABLET | Freq: Two times a day (BID) | ORAL | 1 refills | Status: DC

## 2021-01-14 NOTE — Telephone Encounter (Signed)
Pt recently had a self-swab, this is her new phone # to be called with results  [phone]

## 2021-01-15 NOTE — Telephone Encounter (Signed)
Called pt with swab test results and treatment sent to pharmacy. Pt confirmed understanding.

## 2021-01-25 ENCOUNTER — Other Ambulatory Visit: Payer: Self-pay | Admitting: Adult Health

## 2021-02-10 ENCOUNTER — Ambulatory Visit (INDEPENDENT_AMBULATORY_CARE_PROVIDER_SITE_OTHER): Payer: Medicare Other | Admitting: Adult Health

## 2021-02-10 ENCOUNTER — Encounter: Payer: Self-pay | Admitting: Adult Health

## 2021-02-10 ENCOUNTER — Other Ambulatory Visit (HOSPITAL_COMMUNITY)
Admission: RE | Admit: 2021-02-10 | Discharge: 2021-02-10 | Disposition: A | Discharge status: Home / Self Care | Payer: Medicare Other | Source: Ambulatory Visit | Attending: Adult Health | Admitting: Adult Health

## 2021-02-10 ENCOUNTER — Other Ambulatory Visit: Payer: Self-pay

## 2021-02-10 VITALS — BP 125/79 | HR 67 | Ht 63.0 in | Wt 228.5 lb

## 2021-02-10 DIAGNOSIS — N898 Other specified noninflammatory disorders of vagina: Secondary | ICD-10-CM | POA: Insufficient documentation

## 2021-02-10 MED ORDER — METRONIDAZOLE 500 MG PO TABS: 500.0000 mg | ORAL_TABLET | Freq: Two times a day (BID) | ORAL | 0 refills | Status: DC

## 2021-02-10 NOTE — Progress Notes (Signed)
  Subjective:     Patient ID: Kimberly Black, female   DOB: Jun 24, 1975, 45 y.o.   MRN: [id number]  HPI [name] is a 45 year old black female,single, sp hysterectomy in complaining of vaginal irritation.   Review of Systems +vaginal irritation,and some itching Noticed fishy odor at times Reviewed past medical,surgical, social and family history. Reviewed medications and allergies.     Objective:   Physical Exam BP 125/79 (BP Location: Left Arm, Patient Position: Sitting, Cuff Size: Large)   Pulse 67   Ht '5\' 3"'$  (1.6 m)   Wt 228 lb 8 oz (103.6 kg)   LMP  (LMP Unknown) Comment: last period in Feb.   BMI 40.48 kg/m  Skin warm and dry.Pelvic: external genitalia is normal in appearance no lesions, vagina: white discharge without odor,urethra has no lesions or masses noted, cervix and uterus are absent,adnexa: no masses or tenderness noted. Bladder is non tender and no masses felt. CV swab obtained. Fall risk is low  Upstream - 02/10/21 0953       Pregnancy Intention Screening   Does the patient want to become pregnant in the next year? N/A    Does the patient's partner want to become pregnant in the next year? N/A    Would the patient like to discuss contraceptive options today? N/A      Contraception Wrap Up   Current Method Female Sterilization   hyst   End Method Female Sterilization   hyst   Contraception Counseling Provided No            Examination chaperoned by Levy Pupa LPN     Assessment:     1. Vaginal irritation CV swab sent for GC/CHL,trich,BV and yeast Will rx flagyl,no sex or alcohol during treatment  Meds ordered this encounter  Medications   metroNIDAZOLE (FLAGYL) 500 MG tablet    Sig: Take 1 tablet (500 mg total) by mouth 2 (two) times daily.    Dispense:  14 tablet    Refill:  0    Order Specific Question:   Supervising Provider    Answer:   Elonda Husky, LUTHER H [[id number]]    2. Vaginal discharge   3. Itching in the vaginal area     Plan:      Follow up prn

## 2021-02-11 LAB — CERVICOVAGINAL ANCILLARY ONLY
Bacterial Vaginitis (gardnerella): NEGATIVE
Candida Glabrata: POSITIVE — AB
Candida Vaginitis: POSITIVE — AB
Chlamydia: NEGATIVE
Comment: NEGATIVE
Comment: NEGATIVE
Comment: NEGATIVE
Comment: NEGATIVE
Comment: NEGATIVE
Comment: NORMAL
Neisseria Gonorrhea: NEGATIVE
Trichomonas: NEGATIVE

## 2021-02-12 ENCOUNTER — Telehealth: Payer: Self-pay | Admitting: Adult Health

## 2021-02-12 MED ORDER — FLUCONAZOLE 150 MG PO TABS
ORAL_TABLET | ORAL | 1 refills | Status: DC
Start: 2021-02-12 — End: 2021-05-10

## 2021-02-12 MED ORDER — BORIC ACID VAGINAL 600 MG VA SUPP: 1.0000 | Freq: Three times a day (TID) | VAGINAL | Status: AC

## 2021-02-12 NOTE — Telephone Encounter (Signed)
Pt aware that vaginal swab +Yeast, rx diflucan and boric acid supp.

## 2021-02-15 ENCOUNTER — Telehealth: Payer: Self-pay | Admitting: Adult Health

## 2021-02-15 NOTE — Telephone Encounter (Signed)
Patient called to see if she could get a prescription for the boric acid vaginal 600 mg supp. She said pharmacy said it was a compound and that was the only way she could get it.

## 2021-02-15 NOTE — Telephone Encounter (Signed)
Returned pt's call earlier today for clarification. Two identifiers used. Pt stated that the pharmacy told her she needed a prescription for boric acid suppositories.  Verified prescription with Derrek Monaco. Called pharmacy to give a verbal order for the med. It was stated that they were sold OTC, but that he would try to get them covered.  Called pt to update her on progress. Pt confirmed understanding.

## 2021-04-01 ENCOUNTER — Other Ambulatory Visit: Payer: Self-pay | Admitting: Adult Health

## 2021-04-15 ENCOUNTER — Emergency Department (HOSPITAL_COMMUNITY)
Admission: EM | Admit: 2021-04-15 | Discharge: 2021-04-15 | Disposition: A | Discharge status: Home / Self Care | Payer: Medicare Other | Attending: Emergency Medicine | Admitting: Emergency Medicine

## 2021-04-15 ENCOUNTER — Other Ambulatory Visit: Payer: Self-pay

## 2021-04-15 ENCOUNTER — Encounter (HOSPITAL_COMMUNITY): Payer: Self-pay | Admitting: Emergency Medicine

## 2021-04-15 DIAGNOSIS — Z5321 Procedure and treatment not carried out due to patient leaving prior to being seen by health care provider: Secondary | ICD-10-CM | POA: Diagnosis not present

## 2021-04-15 DIAGNOSIS — J029 Acute pharyngitis, unspecified: Secondary | ICD-10-CM | POA: Insufficient documentation

## 2021-04-15 DIAGNOSIS — J069 Acute upper respiratory infection, unspecified: Secondary | ICD-10-CM | POA: Diagnosis not present

## 2021-04-15 NOTE — ED Triage Notes (Signed)
Pt c/o sore throat since Tuesday. Pt tried OTC medications with no relief.

## 2021-05-07 ENCOUNTER — Other Ambulatory Visit: Payer: Self-pay | Admitting: Adult Health

## 2021-05-25 IMAGING — US ULTRASOUND ABDOMEN LIMITED
1 series · 14 of 25 positions shown · non-contrast
Comparison: None.

CLINICAL DATA: Right upper quadrant pain

EXAM:
ULTRASOUND ABDOMEN LIMITED RIGHT UPPER QUADRANT

[Series 1: ultrasound abdomen limited · 43 acquisitions, 14 frames shown]
[im 1/43]
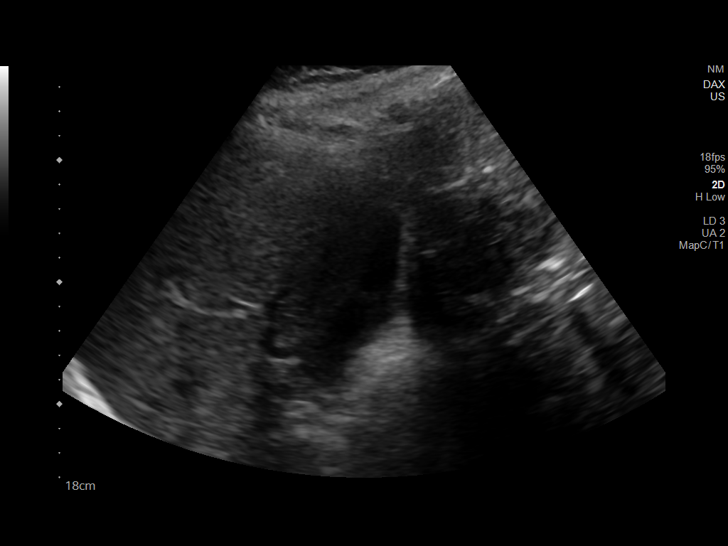
[im 4/43]
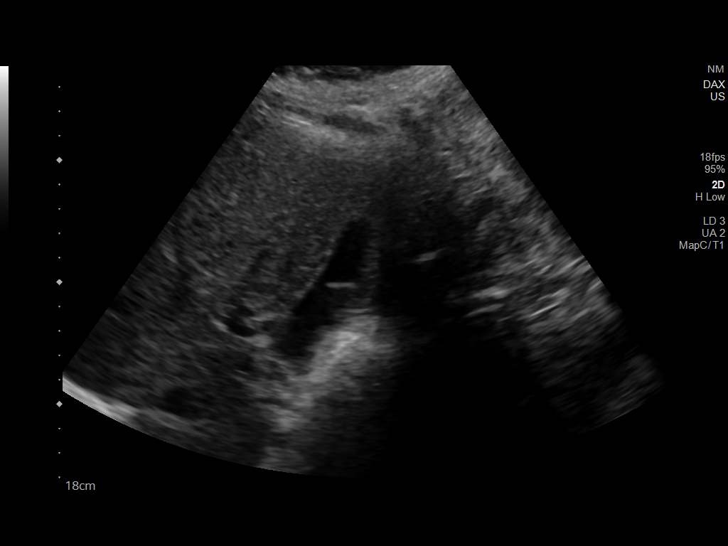
[im 8/43]
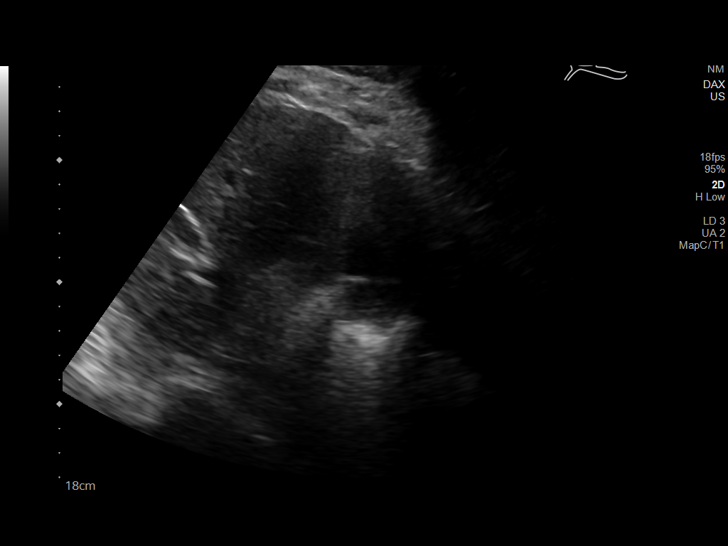
[im 11/43]
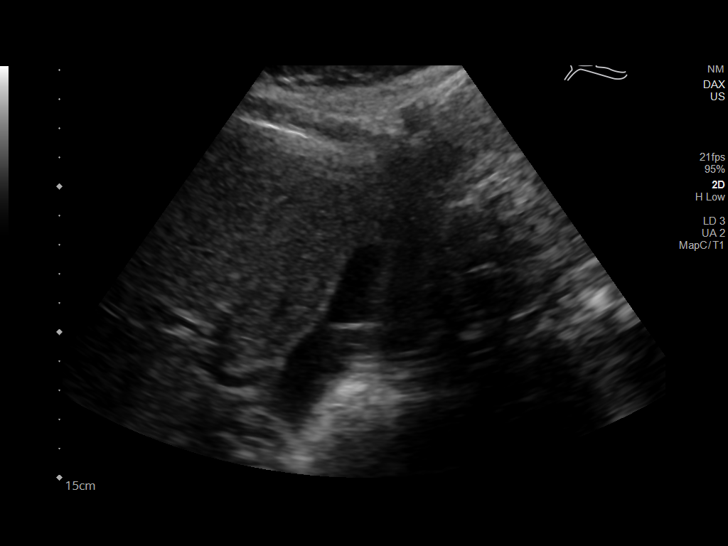
[im 15/43]
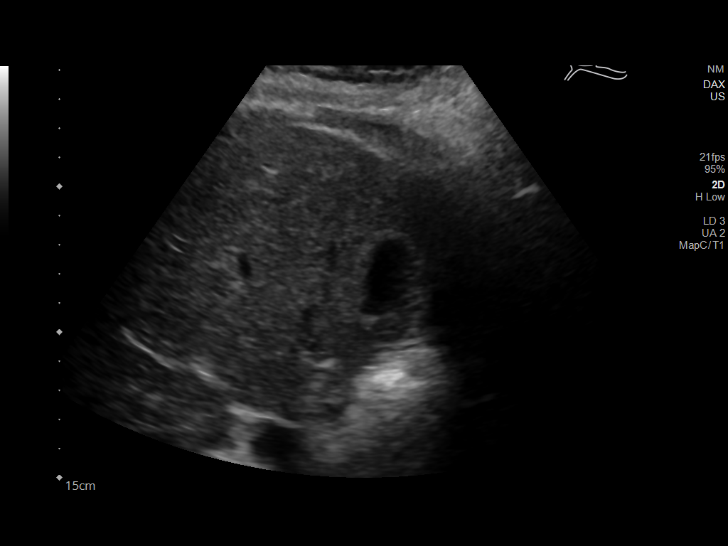
[im 16/43]
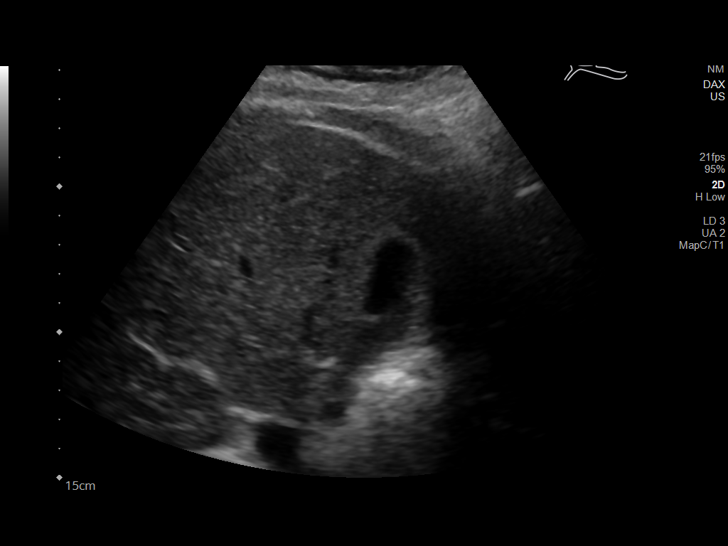
[im 20/43]
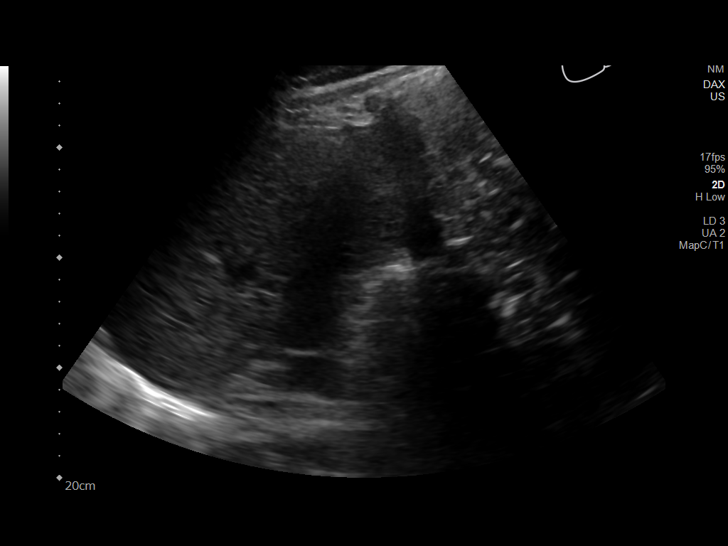
[im 23/43]
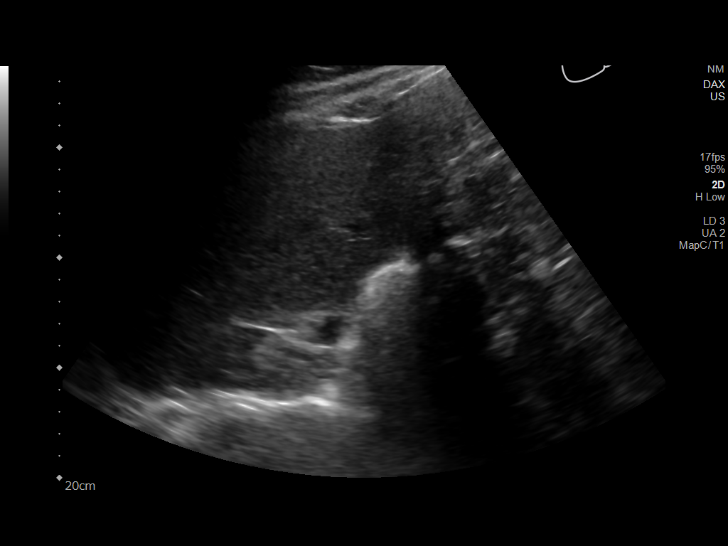
[im 27/43]
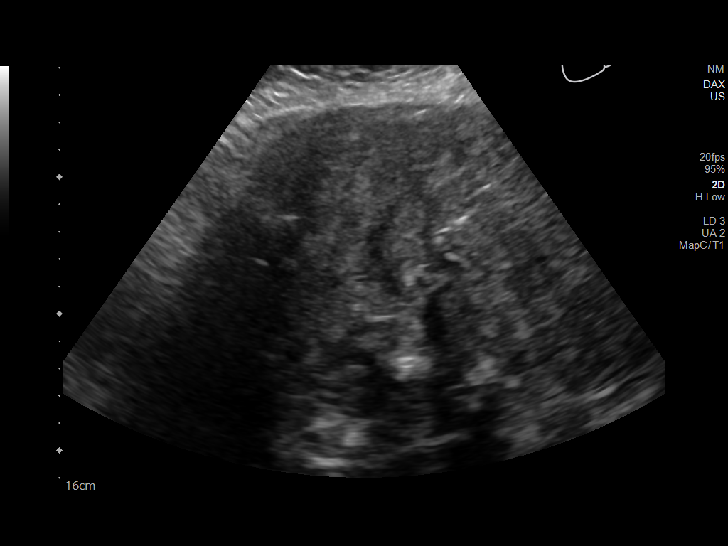
[im 29/43]
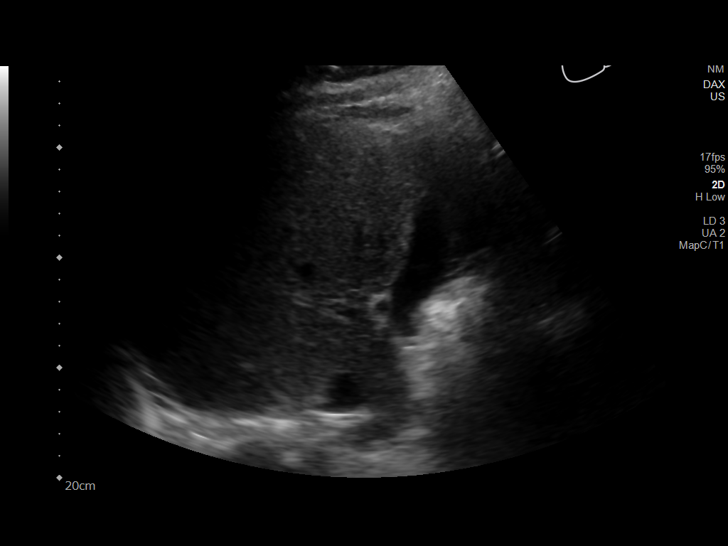
[im 32/43]
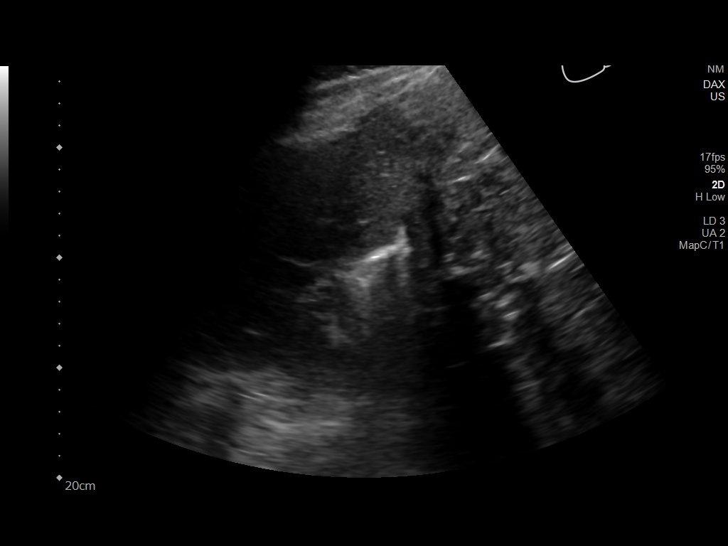
[im 36/43]
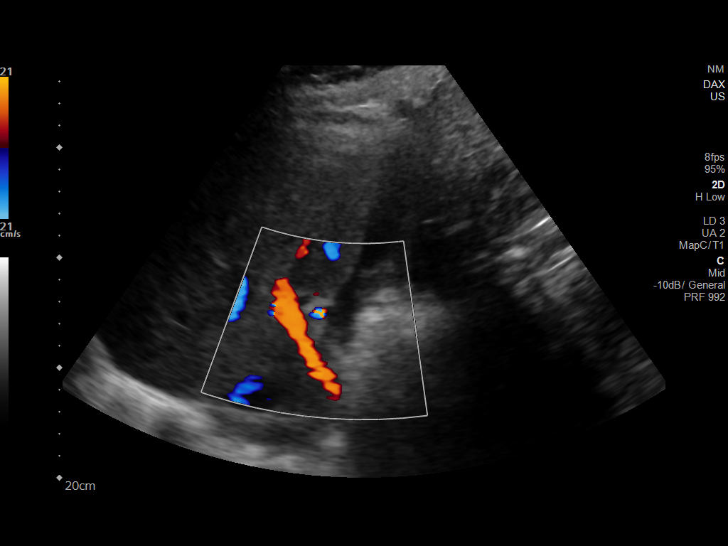
[im 39/43]
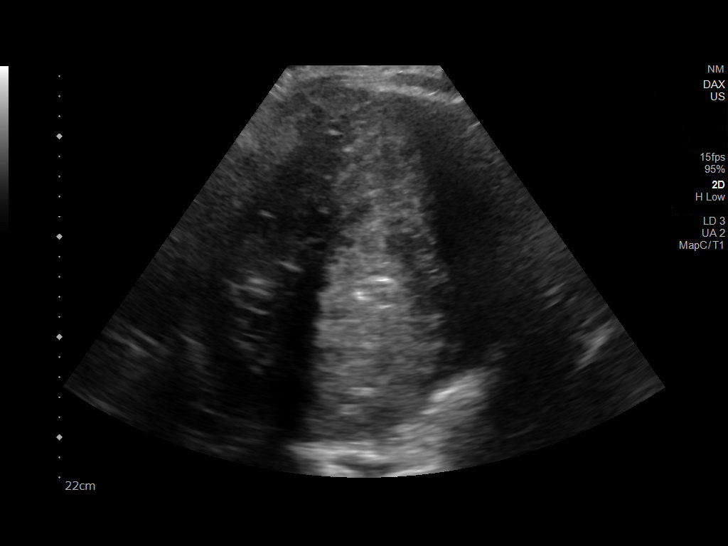
[im 43/43]
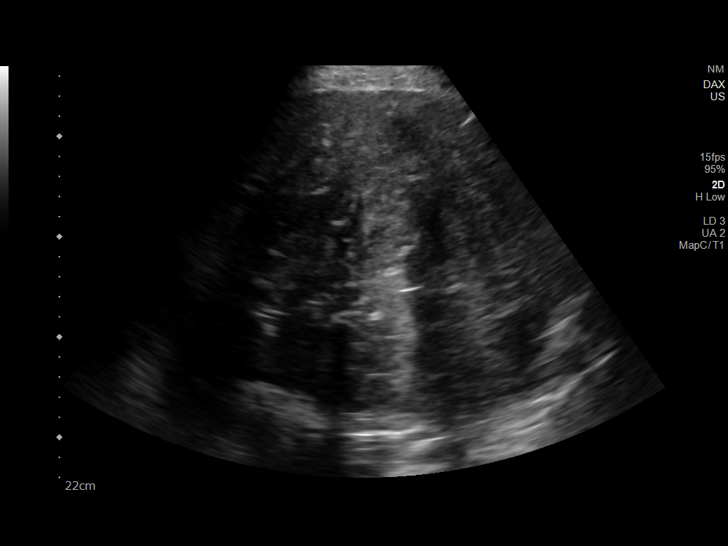

[14 of 25 positions shown; findings below may reference images not displayed]

FINDINGS: Gallbladder:

No gallstones or wall thickening visualized. No sonographic Murphy
sign noted by sonographer.

Common bile duct:

Diameter: Normal caliber, 3 mm

Liver:

No focal lesion identified. Within normal limits in parenchymal
echogenicity. Portal vein is patent on color Doppler imaging with
normal direction of blood flow towards the liver.

Enlarged fibroid uterus extends into the upper abdomen.
IMPRESSION: No acute right upper quadrant process.

Enlarged fibroid uterus extends into the upper abdomen.

## 2021-05-26 ENCOUNTER — Other Ambulatory Visit: Payer: Self-pay | Admitting: Adult Health

## 2021-05-26 IMAGING — CT CT ABDOMEN AND PELVIS WITH CONTRAST
2 of 5 series · 15 of 46 positions shown, 17 images · IV contrast (Omni 300)
Comparison: Ultrasound from the same day.

CLINICAL DATA: Acute abdominal pain.

EXAM:
CT ABDOMEN AND PELVIS WITH CONTRAST
TECHNIQUE: Multidetector CT imaging of the abdomen and pelvis was performed
using the standard protocol following bolus administration of
intravenous contrast.
CONTRAST:  100mL OMNIPAQUE IOHEXOL 300 MG/ML  SOLN

[Series 3: a/p w/ 5mm · axial · 0.93mm/px · z∈[+750,+1230]mm · 12 of 108 slices shown, 14 images]
[im 6/108  soft-tissue]
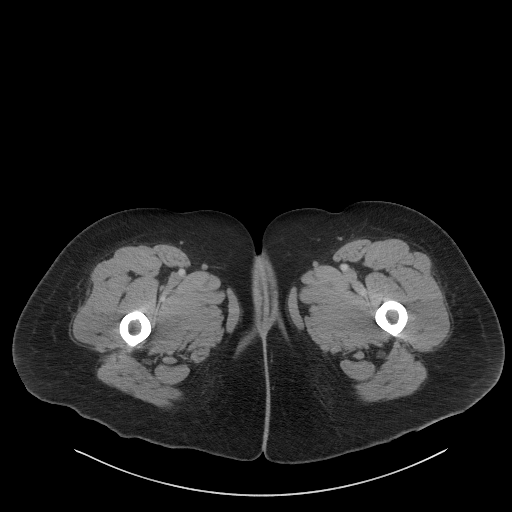
[im 6/108  bone]
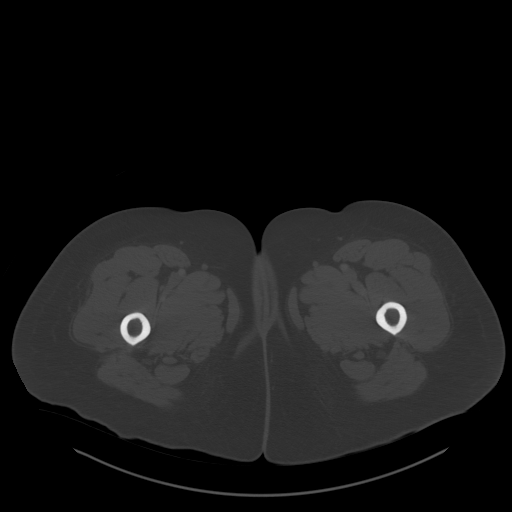
[im 17/108  soft-tissue]
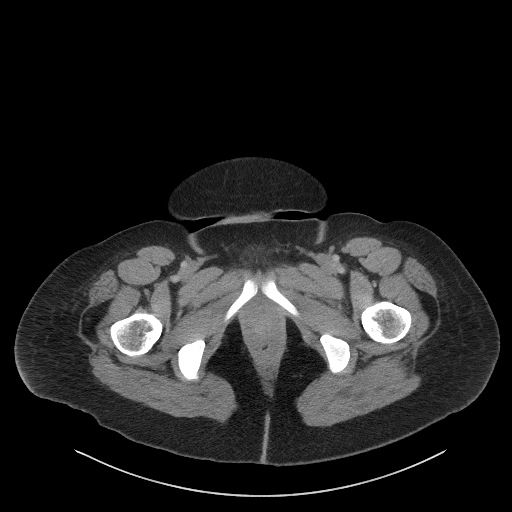
[im 23/108  soft-tissue]
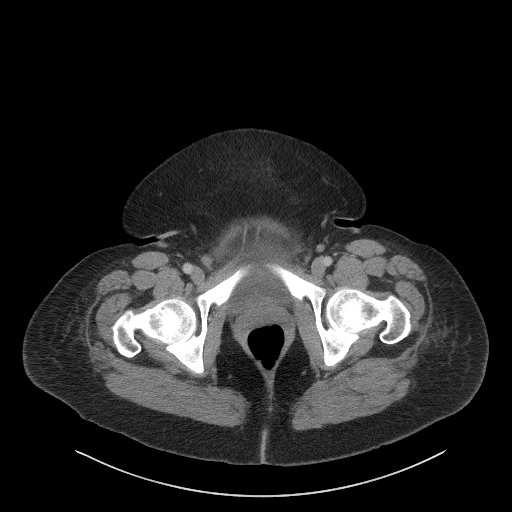
[im 34/108  soft-tissue]
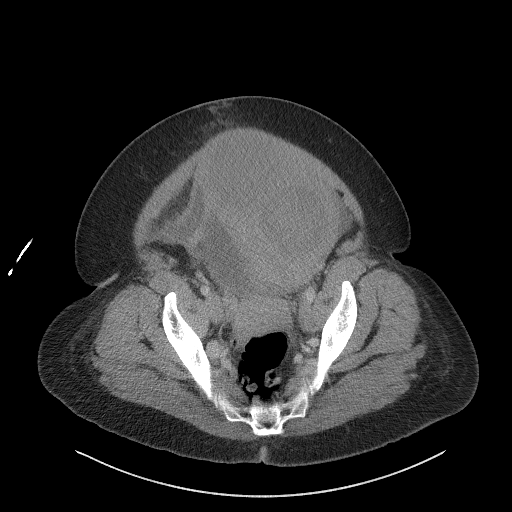
[im 40/108  soft-tissue]
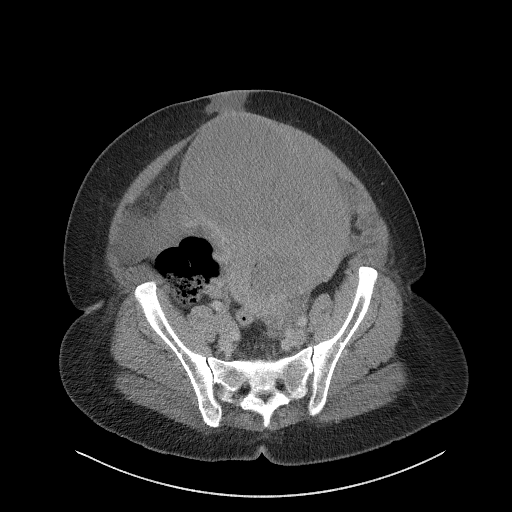
[im 51/108  soft-tissue]
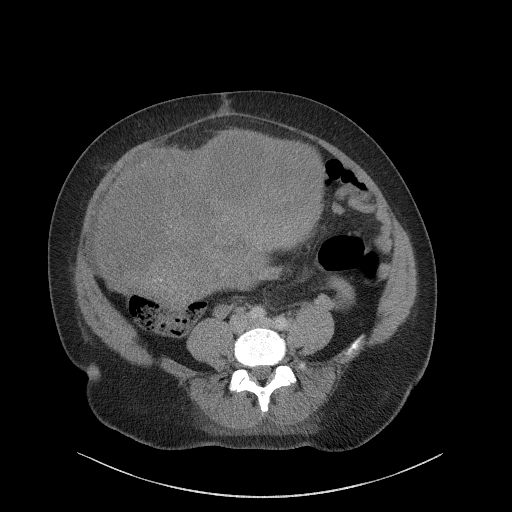
[im 57/108  soft-tissue]
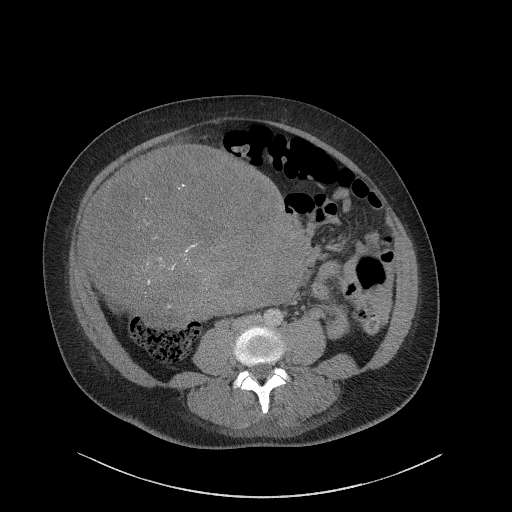
[im 68/108  soft-tissue]
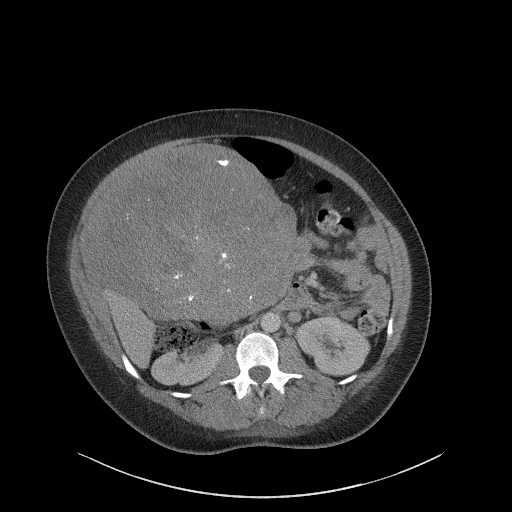
[im 74/108  soft-tissue]
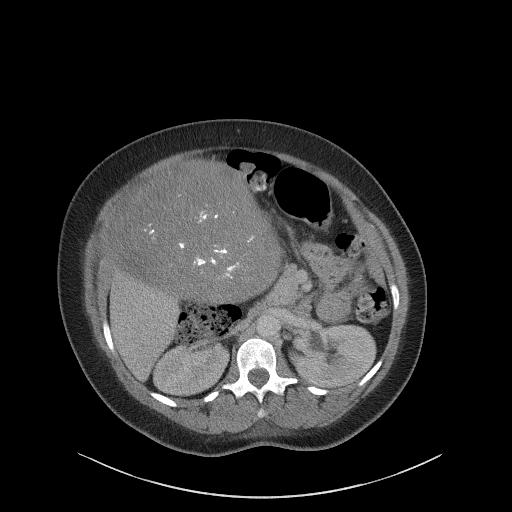
[im 74/108  bone]
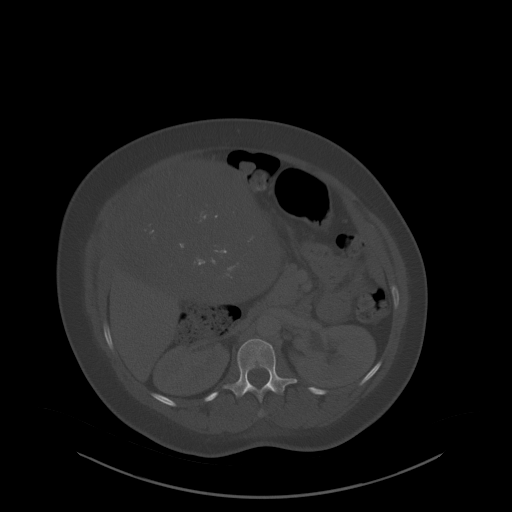
[im 85/108  soft-tissue]
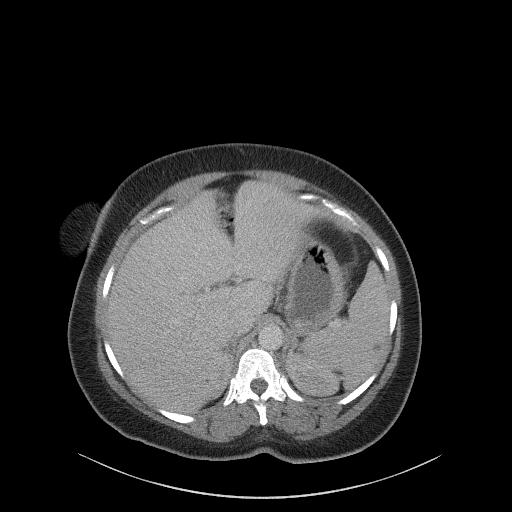
[im 91/108  soft-tissue]
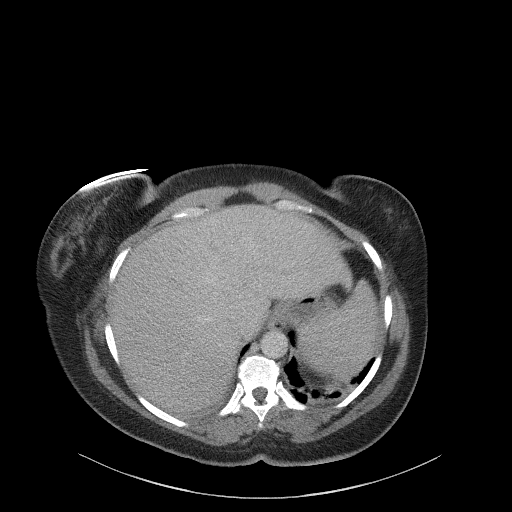
[im 102/108  soft-tissue]
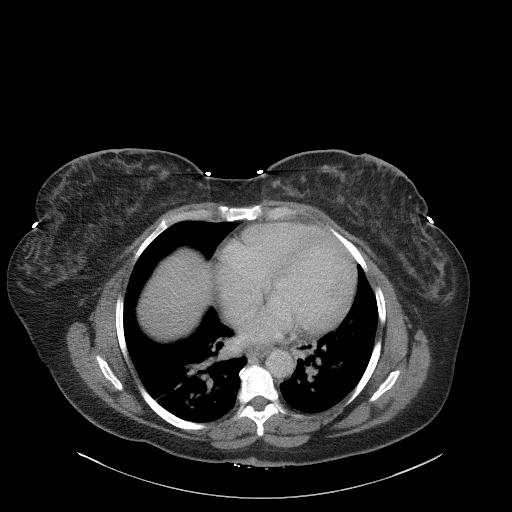

[Series 6: a/p w/ cor · coronal · 0.96mm/px · 3 of 187 slices shown]
[im 63/187  soft-tissue]
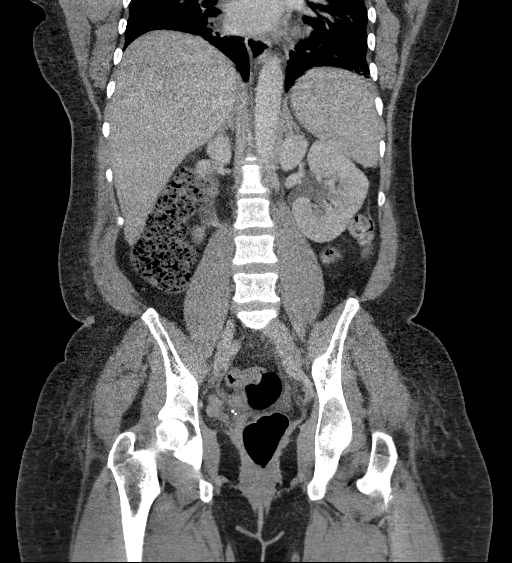
[im 83/187  soft-tissue]
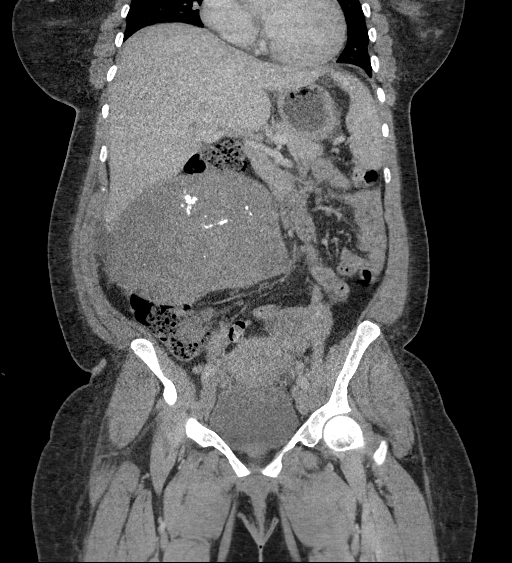
[im 104/187  soft-tissue]
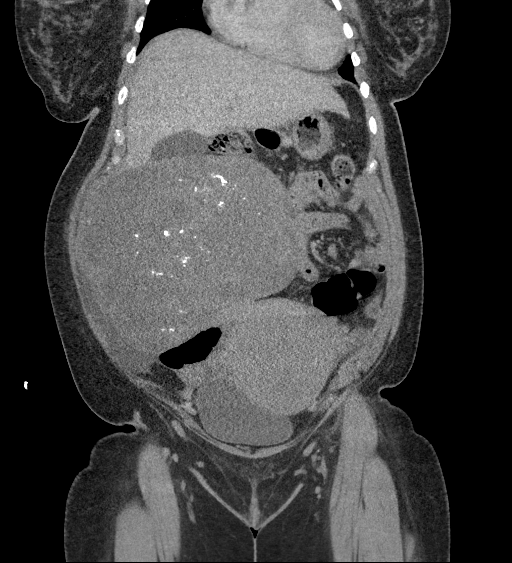

[15 of 46 positions shown; findings below may reference images not displayed]

FINDINGS: Lower chest: There are bibasilar airspace opacities. The heart size
is enlarged.

Hepatobiliary: No focal liver abnormality is seen. No gallstones,
gallbladder wall thickening, or biliary dilatation.

Pancreas: Unremarkable. No pancreatic ductal dilatation or
surrounding inflammatory changes.

Spleen: There is a small 0.9 cm hypoattenuating nodule in the spleen
which is not fully characterized on this exam but statistically most
likely to represent a benign cyst or hemangioma.

Adrenals/Urinary Tract: There is no adrenal abnormality. There is
some mild pelvic fullness bilaterally. The bladder is unremarkable.

Stomach/Bowel: The stomach is unremarkable. There is no evidence of
a small-bowel obstruction the colon is unremarkable. The appendix
appears to be located in the right lower quadrant and is normal.

Vascular/Lymphatic: No significant vascular findings are present. No
enlarged abdominal or pelvic lymph nodes.

Reproductive: There is a very large abnormal uterus measuring
approximately 30 by 21 cm. There are few pockets of gas in what is
presumably the lower uterine segment. The endometrium is not well
evaluated on this exam. The left ovary appears to measure
approximately 5.2 x 3.7 cm. The right ovary is not well evaluated
but is likely located in the right lower quadrant. There is a small
amount of free fluid in the right lower quadrant adjacent to the
presumed location of the right ovary.

Other: No abdominal wall hernia or abnormality. No abdominopelvic
ascites.

Musculoskeletal: No acute or significant osseous findings.
IMPRESSION: 1. Very large lobular heterogeneous uterus measuring at least 30 x
21 cm. This is favored to be secondary to multiple large fibroids.
Outpatient gynecology follow-up is recommended for this finding.
2. Multiple small pockets of gas are noted in what is presumably the
lower uterine segment. This may be secondary to recent surgical
intervention. While there does appear to be some thickening of the
endometrial stripe in this location, overall the endometrial stripe
is not well evaluated on this exam. Detection of retained products
of conception is not possible on this exam. If there is high
clinical suspicion for retained products of conception, follow-up
with contrast enhanced MRI is recommended.
3. Small mild free fluid in the right lower quadrant adjacent to the
right ovary. This may be physiologic or reactive.
4. Bibasilar airspace opacities concerning for developing pneumonia.
Postoperative atelectasis seems less likely this far out from
surgery.
5. Mild bilateral pelviectasis likely secondary to compression of
the distal ureters from the patient's large fibroid uterus.

## 2021-07-12 ENCOUNTER — Other Ambulatory Visit: Payer: Self-pay | Admitting: Adult Health

## 2021-07-30 ENCOUNTER — Other Ambulatory Visit: Payer: Self-pay | Admitting: Adult Health

## 2021-08-10 ENCOUNTER — Telehealth: Payer: Self-pay | Admitting: Adult Health

## 2021-08-10 MED ORDER — CLINDAMYCIN PHOS-BENZOYL PEROX 1-5 % EX GEL: CUTANEOUS | 0 refills | Status: DC

## 2021-08-10 NOTE — Addendum Note (Signed)
Addended by: Derrek Monaco A on: 08/10/2021 01:41 PM ? ? Modules accepted: Orders ? ?

## 2021-08-10 NOTE — Telephone Encounter (Signed)
Patient would like a refill on clindamycin-benzoyl peroxide gel and is still using [facility] for her pharmaceutical needs. Please advise.  ?

## 2021-08-10 NOTE — Telephone Encounter (Signed)
Refilled clindamycin-benzoyl gel ?

## 2021-09-13 ENCOUNTER — Other Ambulatory Visit: Payer: Self-pay | Admitting: Adult Health

## 2021-09-17 ENCOUNTER — Other Ambulatory Visit: Payer: Self-pay | Admitting: Adult Health

## 2021-10-13 ENCOUNTER — Other Ambulatory Visit: Payer: Self-pay | Admitting: Women's Health

## 2021-10-15 ENCOUNTER — Telehealth: Payer: Self-pay | Admitting: Adult Health

## 2021-10-15 MED ORDER — FLUCONAZOLE 150 MG PO TABS: ORAL_TABLET | ORAL | 2 refills | Status: DC

## 2021-10-15 NOTE — Addendum Note (Signed)
Addended by: Derrek Monaco A on: 10/15/2021 02:28 PM   Modules accepted: Orders

## 2021-10-15 NOTE — Telephone Encounter (Signed)
No answer, refilled diflucan to [facility]

## 2021-10-15 NOTE — Telephone Encounter (Signed)
Patient called to check on status of refill request sent in by her pharmacy for diflucan. She stated that Anderson Malta is the one who normally calls this in for her. Please advise.

## 2021-12-01 ENCOUNTER — Other Ambulatory Visit: Payer: Self-pay | Admitting: Adult Health

## 2021-12-22 ENCOUNTER — Encounter: Payer: Self-pay | Admitting: Adult Health

## 2021-12-22 ENCOUNTER — Other Ambulatory Visit (HOSPITAL_COMMUNITY)
Admission: RE | Admit: 2021-12-22 | Discharge: 2021-12-22 | Disposition: A | Discharge status: Home / Self Care | Payer: Medicare Other | Source: Ambulatory Visit | Attending: Adult Health | Admitting: Adult Health

## 2021-12-22 ENCOUNTER — Ambulatory Visit (INDEPENDENT_AMBULATORY_CARE_PROVIDER_SITE_OTHER): Payer: Medicare Other | Admitting: Adult Health

## 2021-12-22 VITALS — BP 153/76 | HR 70 | Ht 63.0 in | Wt 229.5 lb

## 2021-12-22 DIAGNOSIS — N898 Other specified noninflammatory disorders of vagina: Secondary | ICD-10-CM | POA: Insufficient documentation

## 2021-12-22 NOTE — Progress Notes (Signed)
  Subjective:     Patient ID: Kimberly Black, female   DOB: 1976/05/05, 46 y.o.   MRN: [id number]  HPI Kimberly Black is a 46 year old black female,single, sp hysterectomy , in complaining of vaginal irritation and some itching and burning.    Review of Systems +vaginal irritation +vaginal itching and burning Has had sex,no pain Reviewed past medical,surgical, social and family history. Reviewed medications and allergies.     Objective:   Physical Exam BP (!) 153/76 (BP Location: Left Arm, Patient Position: Sitting, Cuff Size: Large)   Pulse 70   Ht '5\' 3"'$  (1.6 m)   Wt 229 lb 8 oz (104.1 kg)   LMP  (LMP Unknown) Comment: last period in Feb.   BMI 40.65 kg/m     Skin warm and dry.Pelvic: external genitalia is normal in appearance no lesions, vagina: white discharge with slight  odor,urethra has no lesions or masses noted, cervix and uterus are absent, adnexa: no masses or tenderness noted. Bladder is non tender and no masses felt. CV swab obtained. Fall risk is low  Upstream - 12/22/21 0947       Pregnancy Intention Screening   Does the patient want to become pregnant in the next year? N/A    Does the patient's partner want to become pregnant in the next year? N/A    Would the patient like to discuss contraceptive options today? N/A      Contraception Wrap Up   Current Method Female Sterilization   hyst   End Method Female Sterilization   hyst           Examination chaperoned by Levy Pupa LPN  Assessment:     1. Vaginal discharge +white discharge with slight odor, will send CV swab  CV swab sent for GC/CHL,trich, BV and yeast, will talk when results back and treat then  2. Vaginal irritation CV swab sent  3. Vaginal odor CV swab sent   4. Itching in the vaginal area CV swab sent     Plan:     Follow up prn

## 2021-12-23 ENCOUNTER — Telehealth: Payer: Self-pay | Admitting: Adult Health

## 2021-12-23 ENCOUNTER — Other Ambulatory Visit: Payer: Self-pay | Admitting: Adult Health

## 2021-12-23 LAB — CERVICOVAGINAL ANCILLARY ONLY
Bacterial Vaginitis (gardnerella): POSITIVE — AB
Candida Glabrata: NEGATIVE
Candida Vaginitis: POSITIVE — AB
Chlamydia: NEGATIVE
Comment: NEGATIVE
Comment: NEGATIVE
Comment: NEGATIVE
Comment: NEGATIVE
Comment: NEGATIVE
Comment: NORMAL
Neisseria Gonorrhea: NEGATIVE
Trichomonas: NEGATIVE

## 2021-12-23 MED ORDER — METRONIDAZOLE 500 MG PO TABS: 500.0000 mg | ORAL_TABLET | Freq: Two times a day (BID) | ORAL | 0 refills | Status: DC

## 2021-12-23 MED ORDER — FLUCONAZOLE 150 MG PO TABS: ORAL_TABLET | ORAL | 1 refills | Status: DC

## 2021-12-23 NOTE — Telephone Encounter (Signed)
Pt would like a call with her results.

## 2021-12-23 NOTE — Telephone Encounter (Signed)
Patient doesn't have mychart and would like a call about her lab result. Please advise.

## 2021-12-23 NOTE — Progress Notes (Signed)
+  Yeast and BV on vaginal swab will rx flagyl and diflucan,no sex or alcohol while taking

## 2021-12-23 NOTE — Telephone Encounter (Signed)
Pt aware swab was + for yeast and BV. Flagyl and Diflucan was sent to pharmacy. No sex or alcohol while on med. Pt voiced understanding. [place]

## 2022-01-24 ENCOUNTER — Other Ambulatory Visit: Payer: Self-pay | Admitting: Adult Health

## 2022-02-21 ENCOUNTER — Other Ambulatory Visit: Payer: Self-pay | Admitting: Adult Health

## 2022-04-04 ENCOUNTER — Other Ambulatory Visit: Payer: Self-pay | Admitting: Adult Health

## 2022-04-25 ENCOUNTER — Other Ambulatory Visit: Payer: Self-pay | Admitting: Adult Health

## 2022-05-16 ENCOUNTER — Ambulatory Visit (INDEPENDENT_AMBULATORY_CARE_PROVIDER_SITE_OTHER): Payer: Medicare Other | Admitting: Adult Health

## 2022-05-16 ENCOUNTER — Encounter: Payer: Self-pay | Admitting: Adult Health

## 2022-05-16 ENCOUNTER — Other Ambulatory Visit (HOSPITAL_COMMUNITY)
Admission: RE | Admit: 2022-05-16 | Discharge: 2022-05-16 | Disposition: A | Discharge status: Home / Self Care | Payer: Medicare Other | Source: Ambulatory Visit | Attending: Adult Health | Admitting: Adult Health

## 2022-05-16 VITALS — BP 118/75 | HR 59 | Ht 63.0 in | Wt 244.0 lb

## 2022-05-16 DIAGNOSIS — N760 Acute vaginitis: Secondary | ICD-10-CM | POA: Diagnosis not present

## 2022-05-16 DIAGNOSIS — Z90710 Acquired absence of both cervix and uterus: Secondary | ICD-10-CM | POA: Diagnosis not present

## 2022-05-16 DIAGNOSIS — N898 Other specified noninflammatory disorders of vagina: Secondary | ICD-10-CM | POA: Diagnosis present

## 2022-05-16 DIAGNOSIS — Z113 Encounter for screening for infections with a predominantly sexual mode of transmission: Secondary | ICD-10-CM | POA: Insufficient documentation

## 2022-05-16 DIAGNOSIS — B9689 Other specified bacterial agents as the cause of diseases classified elsewhere: Secondary | ICD-10-CM | POA: Diagnosis not present

## 2022-05-16 LAB — POCT WET PREP (WET MOUNT)
Clue Cells Wet Prep Whiff POC: POSITIVE
Trichomonas Wet Prep HPF POC: ABSENT

## 2022-05-16 MED ORDER — METRONIDAZOLE 500 MG PO TABS: 500.0000 mg | ORAL_TABLET | Freq: Two times a day (BID) | ORAL | 0 refills | Status: DC

## 2022-05-16 NOTE — Progress Notes (Signed)
  Subjective:     Patient ID: Kimberly Black, female   DOB: 08-Jul-1975, 46 y.o.   MRN: [id number]  HPI Kimberly Black is a 46 year old black female,single, sp hysterectomy yin complaining of vaginal discharge, with itching and burning for few days now.    Review of Systems +vaginal discharge with itching and burning Reviewed past medical,surgical, social and family history. Reviewed medications and allergies.     Objective:   Physical Exam BP 118/75 (BP Location: Left Arm, Patient Position: Sitting, Cuff Size: Large)   Pulse (!) 59   Ht '5\' 3"'$  (1.6 m)   Wt 244 lb (110.7 kg)   LMP  (LMP Unknown) Comment: last period in Feb.   BMI 43.22 kg/m     Skin warm and dry.Pelvic: external genitalia is normal in appearance no lesions, vagina: white discharge with odor,urethra has no lesions or masses noted, cervix and uterus are absent, adnexa: no masses or tenderness noted. Bladder is non tender and no masses felt. Wet prep: + for clue cells CV swab obtained. Fall risk is low  Upstream - 05/16/22 1039       Pregnancy Intention Screening   Does the patient want to become pregnant in the next year? N/A    Does the patient's partner want to become pregnant in the next year? N/A    Would the patient like to discuss contraceptive options today? N/A      Contraception Wrap Up   Current Method Female Sterilization   hyst   End Method Female Sterilization   hyst   Contraception Counseling Provided No            Examination chaperoned by Levy Pupa LPN  Assessment:     1. Vaginal discharge - POCT Wet Prep [facility]) - Cervicovaginal ancillary only( [place])  2. Vaginal itching - POCT Wet Prep ([place] [place]) - Cervicovaginal ancillary only( [place])  3. Vaginal odor - POCT Wet Prep ([place]) - Cervicovaginal ancillary only( [place])  4. BV (bacterial vaginosis) +clue cells on wet prep, and +odor Will rx flagyl,no sex or alcohol while taking Meds ordered this encounter   Medications   metroNIDAZOLE (FLAGYL) 500 MG tablet    Sig: Take 1 tablet (500 mg total) by mouth 2 (two) times daily.    Dispense:  14 tablet    Refill:  0    Order Specific Question:   Supervising Provider    Answer:   Tania Ade H [[id number]]    - POCT Wet Prep [facility])  5. Screening examination for STD (sexually transmitted disease) CV swab sent for GC/CHL,trich and BV and yeast  - Cervicovaginal ancillary only( [place])  6. S/P hysterectomy    Plan:     Follow up prn

## 2022-05-17 ENCOUNTER — Other Ambulatory Visit: Payer: Self-pay | Admitting: Adult Health

## 2022-05-17 ENCOUNTER — Telehealth: Payer: Self-pay | Admitting: Adult Health

## 2022-05-17 LAB — CERVICOVAGINAL ANCILLARY ONLY
Bacterial Vaginitis (gardnerella): POSITIVE — AB
Candida Glabrata: NEGATIVE
Candida Vaginitis: POSITIVE — AB
Chlamydia: NEGATIVE
Comment: NEGATIVE
Comment: NEGATIVE
Comment: NEGATIVE
Comment: NEGATIVE
Comment: NEGATIVE
Comment: NORMAL
Neisseria Gonorrhea: NEGATIVE
Trichomonas: NEGATIVE

## 2022-05-17 MED ORDER — FLUCONAZOLE 150 MG PO TABS: ORAL_TABLET | ORAL | 1 refills | Status: DC

## 2022-05-17 NOTE — Telephone Encounter (Signed)
Left message about vaginal swab and that rx sent in for diflucan

## 2022-05-17 NOTE — Progress Notes (Signed)
Vaginal swab +yeast rx sent for diflucan, and +BV already treated for BV with flagyl

## 2022-07-11 ENCOUNTER — Other Ambulatory Visit: Payer: Self-pay | Admitting: Adult Health

## 2022-07-25 ENCOUNTER — Other Ambulatory Visit: Payer: Self-pay | Admitting: Adult Health

## 2022-08-17 ENCOUNTER — Other Ambulatory Visit: Payer: Self-pay | Admitting: Adult Health

## 2022-08-23 ENCOUNTER — Other Ambulatory Visit: Payer: Self-pay | Admitting: Adult Health

## 2022-09-13 ENCOUNTER — Other Ambulatory Visit: Payer: Self-pay | Admitting: Adult Health

## 2022-09-21 ENCOUNTER — Other Ambulatory Visit: Payer: Self-pay | Admitting: Adult Health

## 2022-10-21 ENCOUNTER — Other Ambulatory Visit: Payer: Self-pay | Admitting: Adult Health

## 2022-10-25 ENCOUNTER — Other Ambulatory Visit: Payer: Self-pay | Admitting: Adult Health

## 2022-11-24 ENCOUNTER — Other Ambulatory Visit: Payer: Self-pay | Admitting: Adult Health

## 2022-12-29 ENCOUNTER — Other Ambulatory Visit: Payer: Self-pay | Admitting: Adult Health

## 2023-01-16 ENCOUNTER — Other Ambulatory Visit: Payer: Self-pay | Admitting: Adult Health

## 2023-01-31 ENCOUNTER — Other Ambulatory Visit: Payer: Self-pay | Admitting: Adult Health

## 2023-03-13 ENCOUNTER — Other Ambulatory Visit: Payer: Self-pay | Admitting: Adult Health

## 2023-03-23 ENCOUNTER — Other Ambulatory Visit: Payer: Self-pay | Admitting: Adult Health

## 2023-04-17 ENCOUNTER — Encounter: Payer: Self-pay | Admitting: Adult Health

## 2023-04-17 ENCOUNTER — Ambulatory Visit: Payer: 59 | Admitting: Adult Health

## 2023-04-17 ENCOUNTER — Other Ambulatory Visit (HOSPITAL_COMMUNITY)
Admission: RE | Admit: 2023-04-17 | Discharge: 2023-04-17 | Disposition: A | Discharge status: Home / Self Care | Payer: 59 | Source: Ambulatory Visit | Attending: Adult Health | Admitting: Adult Health

## 2023-04-17 VITALS — BP 114/71 | HR 78 | Ht 63.0 in | Wt 242.0 lb

## 2023-04-17 DIAGNOSIS — N898 Other specified noninflammatory disorders of vagina: Secondary | ICD-10-CM | POA: Diagnosis not present

## 2023-04-17 DIAGNOSIS — Z113 Encounter for screening for infections with a predominantly sexual mode of transmission: Secondary | ICD-10-CM | POA: Diagnosis not present

## 2023-04-17 DIAGNOSIS — Z90710 Acquired absence of both cervix and uterus: Secondary | ICD-10-CM | POA: Diagnosis not present

## 2023-04-17 MED ORDER — METRONIDAZOLE 0.75 % VA GEL
1.0000 | Freq: Every day | VAGINAL | 0 refills | Status: DC
Start: 2023-04-17 — End: 2023-04-26

## 2023-04-17 NOTE — Progress Notes (Signed)
  Subjective:     Patient ID: WALKER MEYERSON, female   DOB: 1975-06-20, 47 y.o.   MRN: [id number]  HPI Alexandr is a 47 year old black female,single, sp hysterectomy in complaining of vaginal itching and burning started this weekend, and has fishy odor.  Review of Systems +vaginal itching and burning started this weekend,  +has fishy odor.   No new sex partner. Reviewed past medical,surgical, social and family history. Reviewed medications and allergies.  Objective:   Physical Exam BP 114/71 (BP Location: Right Arm, Patient Position: Sitting, Cuff Size: Normal)   Pulse 78   Ht 5\' 3"  (1.6 m)   Wt 242 lb (109.8 kg)   LMP  (LMP Unknown) Comment: last period in Feb.   BMI 42.87 kg/m     Skin warm and dry.Pelvic: external genitalia is normal in appearance no lesions, vagina: white discharge with odor,urethra has no lesions or masses noted, cervix and uterus are absent,  adnexa: no masses or tenderness noted. Bladder is non tender and no masses felt. CV swab obtained.  Upstream - 04/17/23 1332       Pregnancy Intention Screening   Does the patient want to become pregnant in the next year? No    Does the patient's partner want to become pregnant in the next year? No    Would the patient like to discuss contraceptive options today? No      Contraception Wrap Up   Current Method Female Sterilization   hyst   End Method Female Sterilization   hyst   Contraception Counseling Provided No            Examination chaperoned by Faith Rogue LPN  Assessment:     1. Vaginal itching +itching and burning started the weekend  CV swab sent  - Cervicovaginal ancillary only( [place])  2. Vaginal discharge CV swab sent - Cervicovaginal ancillary only( [place])  3. Vaginal odor Has fishy odor, will rx metrogel Meds ordered this encounter  Medications   metroNIDAZOLE (METROGEL) 0.75 % vaginal gel    Sig: Place 1 Applicatorful vaginally at bedtime.    Dispense:  70 g     Refill:  0    Order Specific Question:   Supervising Provider    Answer:   Despina Hidden, LUTHER H [[id number]]    CV swab sent  - Cervicovaginal ancillary only( [place])  4. S/P hysterectomy  5. Screening examination for STD (sexually transmitted disease) CV swab sent for GC/CHL,trich, BV and yeast  - Cervicovaginal ancillary only( [place])    She declines HIV and RPR Plan:     Follow up prn

## 2023-04-19 ENCOUNTER — Other Ambulatory Visit: Payer: Self-pay | Admitting: Adult Health

## 2023-04-19 LAB — CERVICOVAGINAL ANCILLARY ONLY
Bacterial Vaginitis (gardnerella): POSITIVE — AB
Candida Glabrata: NEGATIVE
Candida Vaginitis: POSITIVE — AB
Chlamydia: NEGATIVE
Comment: NEGATIVE
Comment: NEGATIVE
Comment: NEGATIVE
Comment: NEGATIVE
Comment: NEGATIVE
Comment: NORMAL
Neisseria Gonorrhea: NEGATIVE
Trichomonas: NEGATIVE

## 2023-04-19 MED ORDER — FLUCONAZOLE 150 MG PO TABS: ORAL_TABLET | ORAL | 1 refills | Status: DC

## 2023-04-26 ENCOUNTER — Telehealth: Payer: Self-pay | Admitting: Adult Health

## 2023-04-26 ENCOUNTER — Other Ambulatory Visit: Payer: Self-pay | Admitting: Obstetrics and Gynecology

## 2023-04-26 MED ORDER — METRONIDAZOLE 0.75 % VA GEL: 1.0000 | Freq: Every day | VAGINAL | 0 refills | Status: DC

## 2023-04-26 MED ORDER — FLUCONAZOLE 150 MG PO TABS: ORAL_TABLET | ORAL | 0 refills | Status: DC

## 2023-04-26 NOTE — Telephone Encounter (Signed)
Pt is requesting a refill on Metrogel and Diflucan. She is still having symptoms. Thanks! JSY

## 2023-04-26 NOTE — Telephone Encounter (Signed)
Patient would like a nurse to call her about a medication. Please advise.

## 2023-04-26 NOTE — Progress Notes (Signed)
Rx diflucan and metrogel sent

## 2023-06-09 ENCOUNTER — Other Ambulatory Visit: Payer: Self-pay | Admitting: Adult Health

## 2023-07-12 ENCOUNTER — Telehealth: Payer: Self-pay

## 2023-07-12 ENCOUNTER — Other Ambulatory Visit: Payer: Self-pay | Admitting: Adult Health

## 2023-07-12 NOTE — Telephone Encounter (Signed)
Patient would like a refill for her metroNIDAZOLE called in.

## 2023-07-12 NOTE — Telephone Encounter (Signed)
No answer @ 4:27 pm. JSY

## 2023-07-13 NOTE — Telephone Encounter (Signed)
Pt advised to check with [facility] to see if they have refill ready. Pt advised to call if any problems. Pt voiced understanding. JSY

## 2023-07-13 NOTE — Telephone Encounter (Signed)
No answer @ 9:24 am. JSY

## 2023-07-18 ENCOUNTER — Other Ambulatory Visit: Payer: Self-pay

## 2023-07-18 ENCOUNTER — Emergency Department (HOSPITAL_COMMUNITY)
Admission: EM | Admit: 2023-07-18 | Discharge: 2023-07-18 | Discharge status: Against Medical Advice | Payer: 59 | Source: Home / Self Care

## 2023-07-18 ENCOUNTER — Encounter: Payer: Self-pay | Admitting: Emergency Medicine

## 2023-07-18 ENCOUNTER — Ambulatory Visit
Admission: EM | Admit: 2023-07-18 | Discharge: 2023-07-18 | Disposition: A | Discharge status: Home / Self Care | Payer: 59 | Attending: Family Medicine | Admitting: Family Medicine

## 2023-07-18 DIAGNOSIS — M25562 Pain in left knee: Secondary | ICD-10-CM | POA: Diagnosis not present

## 2023-07-18 DIAGNOSIS — S39012A Strain of muscle, fascia and tendon of lower back, initial encounter: Secondary | ICD-10-CM | POA: Diagnosis not present

## 2023-07-18 DIAGNOSIS — M25561 Pain in right knee: Secondary | ICD-10-CM

## 2023-07-18 MED ORDER — TIZANIDINE HCL 4 MG PO CAPS: 4.0000 mg | ORAL_CAPSULE | Freq: Three times a day (TID) | ORAL | 0 refills | Status: AC | PRN

## 2023-07-18 MED ORDER — NAPROXEN 500 MG PO TABS: 500.0000 mg | ORAL_TABLET | Freq: Two times a day (BID) | ORAL | 0 refills | Status: AC | PRN
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
## 2023-07-18 NOTE — Discharge Instructions (Signed)
Your exam is very reassuring today, I suspect muscular strains causing your symptoms.  I have sent over a muscle relaxer and an anti-inflammatory pain medication and you may ice, rest, elevate and use muscle rubs and Tylenol as well.  Follow-up with orthopedics if worsening or not resolving.

## 2023-08-11 ENCOUNTER — Other Ambulatory Visit: Payer: Self-pay | Admitting: Adult Health

## 2023-10-04 ENCOUNTER — Other Ambulatory Visit: Payer: Self-pay | Admitting: Adult Health

## 2023-10-12 ENCOUNTER — Other Ambulatory Visit: Payer: Self-pay | Admitting: Obstetrics and Gynecology

## 2023-10-16 ENCOUNTER — Telehealth: Payer: Self-pay

## 2023-10-16 MED ORDER — FLUCONAZOLE 150 MG PO TABS: ORAL_TABLET | ORAL | 0 refills | Status: DC

## 2023-10-16 NOTE — Telephone Encounter (Signed)
 Refilled diflucan

## 2023-10-16 NOTE — Telephone Encounter (Signed)
 Pt would like for Bridgette Campus to call her in some DIFLUCAN .

## 2023-10-16 NOTE — Telephone Encounter (Signed)
 Pt has itching and is requesting Diflucan . Please advise. Thanks! JSY

## 2023-11-01 ENCOUNTER — Other Ambulatory Visit: Payer: Self-pay | Admitting: Adult Health

## 2023-11-23 ENCOUNTER — Other Ambulatory Visit: Payer: Self-pay | Admitting: Obstetrics and Gynecology

## 2023-11-26 ENCOUNTER — Other Ambulatory Visit: Payer: Self-pay | Admitting: Adult Health

## 2023-12-07 ENCOUNTER — Other Ambulatory Visit: Payer: Self-pay | Admitting: Adult Health

## 2024-01-01 ENCOUNTER — Other Ambulatory Visit: Payer: Self-pay | Admitting: Obstetrics and Gynecology

## 2024-01-04 ENCOUNTER — Other Ambulatory Visit: Payer: Self-pay | Admitting: Adult Health

## 2024-01-08 ENCOUNTER — Telehealth: Payer: Self-pay | Admitting: Adult Health

## 2024-01-08 MED ORDER — FLUCONAZOLE 150 MG PO TABS: ORAL_TABLET | ORAL | 0 refills | Status: DC

## 2024-01-08 NOTE — Telephone Encounter (Signed)
 Refill sent for diflucan 

## 2024-01-08 NOTE — Addendum Note (Signed)
 Addended by: Antonieta Slaven A on: 01/08/2024 09:59 AM   Modules accepted: Orders

## 2024-01-08 NOTE — Telephone Encounter (Signed)
 Patient calling to see if can get a refill on her diflucan  to be sent to [facility]. She states that she has asked they multi times.

## 2024-02-13 ENCOUNTER — Other Ambulatory Visit: Payer: Self-pay | Admitting: Adult Health

## 2024-02-16 ENCOUNTER — Other Ambulatory Visit: Payer: Self-pay | Admitting: Adult Health

## 2024-03-26 ENCOUNTER — Other Ambulatory Visit: Payer: Self-pay | Admitting: Adult Health

## 2024-04-19 ENCOUNTER — Other Ambulatory Visit: Payer: Self-pay | Admitting: Adult Health

## 2024-06-04 ENCOUNTER — Other Ambulatory Visit: Payer: Self-pay | Admitting: Adult Health

## 2024-06-05 ENCOUNTER — Other Ambulatory Visit: Payer: Self-pay | Admitting: Adult Health
